# Patient Record
Sex: Female | Born: 1990 | Race: White | Hispanic: No | Marital: Single | Smoking: Never smoker
Health system: Southern US, Community
[De-identification: ages and names within clinical notes are randomized; demographics above are authoritative.]

## PROBLEM LIST (undated history)

## (undated) HISTORY — PX: SHOULDER SURGERY: SHX246

---

## 2016-11-14 ENCOUNTER — Emergency Department (HOSPITAL_COMMUNITY)
Admission: EM | Admit: 2016-11-14 | Discharge: 2016-11-14 | Disposition: A | Discharge status: Home / Self Care | Payer: Self-pay | Attending: Emergency Medicine | Admitting: Emergency Medicine

## 2016-11-14 ENCOUNTER — Encounter (HOSPITAL_COMMUNITY): Payer: Self-pay | Admitting: Emergency Medicine

## 2016-11-14 DIAGNOSIS — Y9389 Activity, other specified: Secondary | ICD-10-CM | POA: Insufficient documentation

## 2016-11-14 DIAGNOSIS — Y999 Unspecified external cause status: Secondary | ICD-10-CM | POA: Insufficient documentation

## 2016-11-14 DIAGNOSIS — H538 Other visual disturbances: Secondary | ICD-10-CM | POA: Insufficient documentation

## 2016-11-14 DIAGNOSIS — Z5321 Procedure and treatment not carried out due to patient leaving prior to being seen by health care provider: Secondary | ICD-10-CM | POA: Insufficient documentation

## 2016-11-14 DIAGNOSIS — S0990XA Unspecified injury of head, initial encounter: Secondary | ICD-10-CM | POA: Insufficient documentation

## 2016-11-14 DIAGNOSIS — W2209XA Striking against other stationary object, initial encounter: Secondary | ICD-10-CM | POA: Insufficient documentation

## 2016-11-14 DIAGNOSIS — R51 Headache: Secondary | ICD-10-CM | POA: Insufficient documentation

## 2016-11-14 DIAGNOSIS — Y929 Unspecified place or not applicable: Secondary | ICD-10-CM | POA: Insufficient documentation

## 2016-11-14 DIAGNOSIS — R42 Dizziness and giddiness: Secondary | ICD-10-CM | POA: Insufficient documentation

## 2016-11-14 DIAGNOSIS — H9319 Tinnitus, unspecified ear: Secondary | ICD-10-CM | POA: Insufficient documentation

## 2016-11-14 NOTE — ED Triage Notes (Addendum)
Patient was helping friend move and hit her head opn brick wall when fell causing her to black out. Patient c/o headache, blurred vision, dizziness and ears ringing.

## 2016-11-16 ENCOUNTER — Encounter (HOSPITAL_COMMUNITY): Payer: Self-pay | Admitting: Emergency Medicine

## 2016-11-16 ENCOUNTER — Emergency Department (HOSPITAL_COMMUNITY): Payer: Managed Care, Other (non HMO)

## 2016-11-16 DIAGNOSIS — Y92009 Unspecified place in unspecified non-institutional (private) residence as the place of occurrence of the external cause: Secondary | ICD-10-CM | POA: Insufficient documentation

## 2016-11-16 DIAGNOSIS — W01198A Fall on same level from slipping, tripping and stumbling with subsequent striking against other object, initial encounter: Secondary | ICD-10-CM | POA: Insufficient documentation

## 2016-11-16 DIAGNOSIS — S060X1A Concussion with loss of consciousness of 30 minutes or less, initial encounter: Secondary | ICD-10-CM | POA: Insufficient documentation

## 2016-11-16 DIAGNOSIS — Y999 Unspecified external cause status: Secondary | ICD-10-CM | POA: Diagnosis not present

## 2016-11-16 DIAGNOSIS — S0990XA Unspecified injury of head, initial encounter: Secondary | ICD-10-CM | POA: Diagnosis present

## 2016-11-16 DIAGNOSIS — Y9389 Activity, other specified: Secondary | ICD-10-CM | POA: Insufficient documentation

## 2016-11-16 NOTE — ED Triage Notes (Signed)
Pt st's she was helping a friend move and hit her head on a brick wall.  Pt st's she blacked out afterwards.  Pt c/o ongoing headache, nausea and vomiting.

## 2016-11-17 ENCOUNTER — Emergency Department (HOSPITAL_COMMUNITY)
Admission: EM | Admit: 2016-11-17 | Discharge: 2016-11-17 | Disposition: A | Discharge status: Home / Self Care | Payer: Managed Care, Other (non HMO) | Attending: Emergency Medicine | Admitting: Emergency Medicine

## 2016-11-17 DIAGNOSIS — S0990XA Unspecified injury of head, initial encounter: Secondary | ICD-10-CM

## 2016-11-17 DIAGNOSIS — S060X1A Concussion with loss of consciousness of 30 minutes or less, initial encounter: Secondary | ICD-10-CM

## 2016-11-17 LAB — CBC WITH DIFFERENTIAL/PLATELET
BASOS ABS: 0 10*3/uL (ref 0.0–0.1)
BASOS PCT: 0 %
EOS ABS: 0.1 10*3/uL (ref 0.0–0.7)
Eosinophils Relative: 2 %
HCT: 41 % (ref 36.0–46.0)
HEMOGLOBIN: 13.5 g/dL (ref 12.0–15.0)
Lymphocytes Relative: 42 %
Lymphs Abs: 3.3 10*3/uL (ref 0.7–4.0)
MCH: 29.5 pg (ref 26.0–34.0)
MCHC: 32.9 g/dL (ref 30.0–36.0)
MCV: 89.7 fL (ref 78.0–100.0)
MONOS PCT: 9 %
Monocytes Absolute: 0.7 10*3/uL (ref 0.1–1.0)
NEUTROS PCT: 47 %
Neutro Abs: 3.6 10*3/uL (ref 1.7–7.7)
Platelets: 256 10*3/uL (ref 150–400)
RBC: 4.57 MIL/uL (ref 3.87–5.11)
RDW: 13.4 % (ref 11.5–15.5)
WBC: 7.8 10*3/uL (ref 4.0–10.5)

## 2016-11-17 LAB — COMPREHENSIVE METABOLIC PANEL
ALK PHOS: 58 U/L (ref 38–126)
ALT: 15 U/L (ref 14–54)
ANION GAP: 9 (ref 5–15)
AST: 20 U/L (ref 15–41)
Albumin: 4.2 g/dL (ref 3.5–5.0)
BILIRUBIN TOTAL: 0.5 mg/dL (ref 0.3–1.2)
BUN: 9 mg/dL (ref 6–20)
CALCIUM: 9.3 mg/dL (ref 8.9–10.3)
CO2: 26 mmol/L (ref 22–32)
CREATININE: 0.91 mg/dL (ref 0.44–1.00)
Chloride: 106 mmol/L (ref 101–111)
Glucose, Bld: 80 mg/dL (ref 65–99)
Potassium: 3.8 mmol/L (ref 3.5–5.1)
SODIUM: 141 mmol/L (ref 135–145)
TOTAL PROTEIN: 6.7 g/dL (ref 6.5–8.1)

## 2016-11-17 LAB — I-STAT BETA HCG BLOOD, ED (MC, WL, AP ONLY)

## 2016-11-17 MED ORDER — NAPROXEN 500 MG PO TABS: 500.0000 mg | ORAL_TABLET | Freq: Two times a day (BID) | ORAL | 0 refills | Status: DC

## 2016-11-17 MED ORDER — OXYCODONE-ACETAMINOPHEN 5-325 MG PO TABS
1.0000 | ORAL_TABLET | Freq: Once | ORAL | Status: AC
  Administered 2016-11-17: 1 via ORAL
  Filled 2016-11-17: qty 1

## 2016-11-17 MED ORDER — HYDROCODONE-ACETAMINOPHEN 5-325 MG PO TABS: 1.0000 | ORAL_TABLET | Freq: Four times a day (QID) | ORAL | 0 refills | Status: DC | PRN

## 2016-11-17 NOTE — Discharge Instructions (Signed)
You were seen today for head injury. You likely have a concussion. Decrease stimulation and rest.Naproxen as needed for pain. Follow-up in concussion clinic for clearance for any physical activity.

## 2016-11-17 NOTE — ED Provider Notes (Signed)
[facility] EMERGENCY DEPARTMENT Provider Note   CSN: [id number] Arrival date & time: 11/16/16  2139     History   Chief Complaint Chief Complaint  Patient presents with  . Head Injury    HPI Kayla Buchanan is a 26 y.o. female.  HPI  This is a 26 year old female who presents with head injury. Patient reports that she was helping her friend move when she hit her head on a wall. She fell forward and struck her forehead. She lost consciousness. This occurred on Saturday evening. Since that time she has had worsening headache, nausea, vomiting, and foggy headedness. Currently her pain is 5 out of 10. She reports left-sided neck pain.  Denies weakness, numbness, tingling of her upper extremities.She has been taking Tylenol with minimal relief. Denies any other injury.    History reviewed. No pertinent past medical history.  There are no active problems to display for this patient.   Past Surgical History:  Procedure Laterality Date  . SHOULDER SURGERY Left     OB History    No data available       Home Medications    Prior to Admission medications   Medication Sig Start Date End Date Taking? Authorizing Provider  HYDROcodone-acetaminophen (NORCO/VICODIN) 5-325 MG tablet Take 1 tablet by mouth every 6 (six) hours as needed. 11/17/16   Khalee Mazo, Mayer Masker, MD  naproxen (NAPROSYN) 500 MG tablet Take 1 tablet (500 mg total) by mouth 2 (two) times daily. 11/17/16   Airianna Kreischer, Mayer Masker, MD    Family History No family history on file.  Social History Social History  Substance Use Topics  . Smoking status: Never Smoker  . Smokeless tobacco: Never Used  . Alcohol use No     Allergies   Patient has no allergy information on record.   Review of Systems Review of Systems  Constitutional: Negative for fever.  Eyes: Positive for photophobia.  Respiratory: Negative for shortness of breath.   Cardiovascular: Negative for chest pain.  Gastrointestinal:  Positive for nausea and vomiting.  Musculoskeletal: Positive for neck pain.  Skin: Negative for wound.  Neurological: Positive for light-headedness and headaches. Negative for weakness.  All other systems reviewed and are negative.    Physical Exam Updated Vital Signs BP (!) 143/76 (BP Location: Right Arm)   Pulse 73   Temp 98.2 F (36.8 C) (Oral)   Resp 14   Ht  (1.676 m)   Wt 64.9 kg (143 lb)   LMP 11/07/2016   SpO2 100%   BMI 23.08 kg/m   Physical Exam  Constitutional: She is oriented to person, place, and time. She appears well-developed and well-nourished. No distress.  HENT:  Head: Normocephalic and atraumatic.  Mouth/Throat: Oropharynx is clear and moist.  No palpable hematoma, tenderness palpation over the left parietal region  Eyes: Pupils are equal, round, and reactive to light. EOM are normal.  Neck: Normal range of motion. Neck supple.  Tenderness to palpation the left paraspinous muscle region of the cervical spine  Cardiovascular: Normal rate, regular rhythm and normal heart sounds.   No murmur heard. Pulmonary/Chest: Effort normal and breath sounds normal. No respiratory distress. She has no wheezes.  Abdominal: Soft. Bowel sounds are normal.  Neurological: She is alert and oriented to person, place, and time.  Cranial nerves II through XII intact, 5 out of 5 strength in all 4 extremity as, no dysmetria to finger-nose-finger  Skin: Skin is warm and dry.  Psychiatric: She has  a normal mood and affect.  Nursing note and vitals reviewed.    ED Treatments / Results  Labs (all labs ordered are listed, but only abnormal results are displayed) Labs Reviewed  CBC WITH DIFFERENTIAL/PLATELET  COMPREHENSIVE METABOLIC PANEL  I-STAT BETA HCG BLOOD, ED ([facility], [facility], [facility] ONLY)    EKG  EKG Interpretation None       Radiology Ct Head Wo Contrast  Result Date: 11/16/2016 CLINICAL DATA:  Headache after hitting head on brick wall. Positive loss of  consciousness and blurred vision. EXAM: CT HEAD WITHOUT CONTRAST TECHNIQUE: Contiguous axial images were obtained from the base of the skull through the vertex without intravenous contrast. COMPARISON:  None. FINDINGS: Brain: No evidence of acute infarction, hemorrhage, hydrocephalus, extra-axial collection or mass lesion/mass effect. Vascular: No hyperdense vessel or unexpected calcification. Skull: Normal. Negative for fracture or focal lesion. Sinuses/Orbits: No acute finding. Other: None IMPRESSION: No acute intracranial abnormality. Electronically Signed   By: Tollie Eth M.D.   On: 11/16/2016 23:14    Procedures Procedures (including critical care time)  Medications Ordered in ED Medications  oxyCODONE-acetaminophen (PERCOCET/ROXICET) 5-325 MG per tablet 1 tablet (not administered)     Initial Impression / Assessment and Plan / ED Course  I have reviewed the triage vital signs and the nursing notes.  Pertinent labs & imaging results that were available during my care of the patient were reviewed by me and considered in my medical decision making (see chart for details).     Patient presents with head injury that occurred on Saturday. She is overall nontoxic-appearing and vital signs are reassuring. Neurologic exam is reassuring as well. CT scan head reviewed from triage and negative. Suspect concussion symptoms. She has no midline neck tenderness. Does not require further imaging at this time. Discussed with patient treatment and symptomatic control. Follow-up in concussion clinic.  After history, exam, and medical workup I feel the patient has been appropriately medically screened and is safe for discharge home. Pertinent diagnoses were discussed with the patient. Patient was given return precautions.   Final Clinical Impressions(s) / ED Diagnoses   Final diagnoses:  Injury of head, initial encounter  Concussion with loss of consciousness of 30 minutes or less, initial encounter     New Prescriptions New Prescriptions   HYDROCODONE-ACETAMINOPHEN (NORCO/VICODIN) 5-325 MG TABLET    Take 1 tablet by mouth every 6 (six) hours as needed.   NAPROXEN (NAPROSYN) 500 MG TABLET    Take 1 tablet (500 mg total) by mouth 2 (two) times daily.     Shon Baton, MD 11/17/16 [phone]

## 2018-04-05 ENCOUNTER — Other Ambulatory Visit: Payer: Self-pay

## 2018-04-05 ENCOUNTER — Inpatient Hospital Stay (HOSPITAL_COMMUNITY)
Admission: EM | Admit: 2018-04-05 | Discharge: 2018-04-07 | DRG: 880 | Disposition: A | Discharge status: Home / Self Care | Payer: 59 | Attending: Internal Medicine | Admitting: Internal Medicine

## 2018-04-05 ENCOUNTER — Encounter (HOSPITAL_COMMUNITY): Payer: Self-pay | Admitting: *Deleted

## 2018-04-05 DIAGNOSIS — D649 Anemia, unspecified: Secondary | ICD-10-CM | POA: Diagnosis not present

## 2018-04-05 DIAGNOSIS — J9690 Respiratory failure, unspecified, unspecified whether with hypoxia or hypercapnia: Secondary | ICD-10-CM

## 2018-04-05 DIAGNOSIS — F445 Conversion disorder with seizures or convulsions: Principal | ICD-10-CM | POA: Diagnosis present

## 2018-04-05 DIAGNOSIS — N179 Acute kidney failure, unspecified: Secondary | ICD-10-CM | POA: Diagnosis present

## 2018-04-05 DIAGNOSIS — R569 Unspecified convulsions: Secondary | ICD-10-CM

## 2018-04-05 DIAGNOSIS — J9600 Acute respiratory failure, unspecified whether with hypoxia or hypercapnia: Secondary | ICD-10-CM | POA: Diagnosis present

## 2018-04-05 DIAGNOSIS — R040 Epistaxis: Secondary | ICD-10-CM | POA: Diagnosis present

## 2018-04-05 DIAGNOSIS — Z8669 Personal history of other diseases of the nervous system and sense organs: Secondary | ICD-10-CM

## 2018-04-05 DIAGNOSIS — Z978 Presence of other specified devices: Secondary | ICD-10-CM

## 2018-04-05 DIAGNOSIS — Z887 Allergy status to serum and vaccine status: Secondary | ICD-10-CM

## 2018-04-05 DIAGNOSIS — Z885 Allergy status to narcotic agent status: Secondary | ICD-10-CM

## 2018-04-05 DIAGNOSIS — E876 Hypokalemia: Secondary | ICD-10-CM | POA: Diagnosis not present

## 2018-04-05 DIAGNOSIS — Z820 Family history of epilepsy and other diseases of the nervous system: Secondary | ICD-10-CM

## 2018-04-05 MED ORDER — OXYMETAZOLINE HCL 0.05 % NA SOLN
1.0000 | Freq: Once | NASAL | Status: AC
  Administered 2018-04-05: 1 via NASAL
  Filled 2018-04-05: qty 30

## 2018-04-05 MED ORDER — LORAZEPAM 2 MG/ML IJ SOLN
1.0000 mg | Freq: Once | INTRAMUSCULAR | Status: AC
  Administered 2018-04-05: 2 mg via INTRAVENOUS
  Filled 2018-04-05: qty 1

## 2018-04-05 MED ORDER — ACETAMINOPHEN 325 MG PO TABS
650.0000 mg | ORAL_TABLET | Freq: Once | ORAL | Status: AC
  Administered 2018-04-06: 650 mg via ORAL
  Filled 2018-04-05: qty 2

## 2018-04-05 MED ORDER — LEVETIRACETAM IN NACL 1000 MG/100ML IV SOLN
INTRAVENOUS | Status: AC
  Administered 2018-04-06
  Filled 2018-04-05: qty 100

## 2018-04-05 MED ORDER — SODIUM CHLORIDE 0.9 % IV BOLUS
500.0000 mL | Freq: Once | INTRAVENOUS | Status: AC
  Administered 2018-04-06: 500 mL via INTRAVENOUS

## 2018-04-05 NOTE — ED Triage Notes (Signed)
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

## 2018-04-05 NOTE — ED Provider Notes (Addendum)
[facility]-EMERGENCY DEPT Provider Note   CSN: [id number] Arrival date & time: 04/05/18  2203    History   Chief Complaint Chief Complaint  Patient presents with  . Epistaxis    HPI Kayla Buchanan is a 28 y.o. female.     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  She reports bleeding, which caused her to cough up blood several times over half hour period of time.  The bleeding stopped spontaneously.  No trauma to the nose.  No recent sinus or nasal symptoms.  She does not take anticoagulant medications.  She notes a headache starting after the bleeding.  No modifying factors.  History reviewed. No pertinent past medical history.  Patient Active Problem List   Diagnosis Date Noted  . Seizure (HCC) 04/06/2018    Past Surgical History:  Procedure Laterality Date  . SHOULDER SURGERY Left      OB History   No obstetric history on file.      Home Medications    Prior to Admission medications   Medication Sig Start Date End Date Taking? Authorizing Provider  HYDROcodone-acetaminophen (NORCO/VICODIN) 5-325 MG tablet Take 1 tablet by mouth every 6 (six) hours as needed. 11/17/16   Horton, Mayer Masker, MD  naproxen (NAPROSYN) 500 MG tablet Take 1 tablet (500 mg total) by mouth 2 (two) times daily. 11/17/16   Horton, Mayer Masker, MD    Family History No family history on file.  Social History Social History   Tobacco Use  . Smoking status: Never Smoker  . Smokeless tobacco: Never Used  Substance Use Topics  . Alcohol use: Yes  . Drug use: No     Allergies   Patient has no allergy information on record.   Review of Systems Review of Systems  All other systems reviewed and are negative.    Physical Exam Updated Vital Signs BP (!) 141/81   Pulse (!) 115   Temp 98.1 F (36.7 C) (Oral)   Resp 16   Ht 5\' 6"  (1.676 m)   Wt 66.7 kg   LMP  (LMP Unknown)   SpO2 100%    BMI 23.73 kg/m   Physical Exam Vitals signs and nursing note reviewed.  Constitutional:      General: She is not in acute distress.    Appearance: She is well-developed. She is not ill-appearing, toxic-appearing or diaphoretic.  HENT:     Head: Normocephalic and atraumatic.     Right Ear: External ear normal.     Left Ear: External ear normal.     Nose:     Comments: Small amount of dried blood in right anterior nares.  No visible site of bleeding in the right nares.  Left nares normal without bleeding, or lesions.    Mouth/Throat:     Mouth: Mucous membranes are moist.     Pharynx: Oropharynx is clear. No oropharyngeal exudate.     Comments: No visible blood in oropharynx. Eyes:     Conjunctiva/sclera: Conjunctivae normal.     Pupils: Pupils are equal, round, and reactive to light.  Neck:     Musculoskeletal: Normal range of motion and neck supple.     Trachea: Phonation normal.  Cardiovascular:     Rate and Rhythm: Normal rate.  Pulmonary:     Effort: Pulmonary effort is normal.  Musculoskeletal: Normal range of motion.  Skin:    General: Skin is warm and  dry.  Neurological:     Mental Status: She is alert and oriented to person, place, and time.     Cranial Nerves: No cranial nerve deficit.     Sensory: No sensory deficit.     Motor: No abnormal muscle tone.     Coordination: Coordination normal.  Psychiatric:        Mood and Affect: Mood normal.        Behavior: Behavior normal.        Thought Content: Thought content normal.        Judgment: Judgment normal.      ED Treatments / Results  Labs (all labs ordered are listed, but only abnormal results are displayed) Labs Reviewed  BASIC METABOLIC PANEL - Abnormal; Notable for the following components:      Result Value   Glucose, Bld 103 (*)    BUN 28 (*)    Creatinine, Ser 1.01 (*)    All other components within normal limits  CBC WITH DIFFERENTIAL/PLATELET - Abnormal; Notable for the following components:    WBC 10.6 (*)    Lymphs Abs 4.6 (*)    Monocytes Absolute 1.4 (*)    All other components within normal limits    EKG None  Radiology No results found.  Procedures Date/Time: 04/06/2018 12:23 AM Performed by: Mancel Bale, MD Pre-anesthesia Checklist: Emergency Drugs available, Patient identified, Patient being monitored and Timeout performed Preoxygenation: Pre-oxygenation with 100% oxygen Induction Type: Rapid sequence Ventilation: Mask ventilation without difficulty Laryngoscope Size: 3 Grade View: Grade III Tube type: Subglottic suction tube Tube size: 7.5 mm Number of attempts: 1 Airway Equipment and Method: Video-laryngoscopy Placement Confirmation: ETT inserted through vocal cords under direct vision,  Breath sounds checked- equal and bilateral and Positive ETCO2 Secured at: 24 cm Tube secured with: ETT holder Dental Injury: Teeth and Oropharynx as per pre-operative assessment     .Critical Care Performed by: Mancel Bale, MD Authorized by: Mancel Bale, MD   Critical care provider statement:    Critical care time (minutes):  45   Critical care start time:  04/05/2018 10:50 PM   Critical care end time:  04/06/2018 12:49 AM   Critical care time was exclusive of:  Separately billable procedures and treating other patients   Critical care was necessary to treat or prevent imminent or life-threatening deterioration of the following conditions:  CNS failure or compromise   Critical care was time spent personally by me on the following activities:  Blood draw for specimens, development of treatment plan with patient or surrogate, discussions with consultants, evaluation of patient's response to treatment, examination of patient, obtaining history from patient or surrogate, ordering and performing treatments and interventions, ordering and review of laboratory studies, pulse oximetry, re-evaluation of patient's condition, review of old charts and ordering and review of  radiographic studies   (including critical care time)  Medications Ordered in ED Medications  acetaminophen (TYLENOL) tablet 650 mg (has no administration in time range)  propofol (DIPRIVAN) 1000 MG/100ML infusion (25 mcg/kg/min  66.7 kg Intravenous Rate/Dose Change 04/06/18 0037)  levETIRAcetam (KEPPRA) IVPB 500 mg/100 mL premix (500 mg Intravenous New Bag/Given 04/06/18 0048)  oxymetazoline (AFRIN) 0.05 % nasal spray 1 spray (1 spray Each Nare Given 04/05/18 2238)  sodium chloride 0.9 % bolus 500 mL (500 mLs Intravenous New Bag/Given 04/06/18 0000)  LORazepam (ATIVAN) injection 1 mg (2 mg Intravenous Given 04/05/18 2354)  levETIRAcetam (KEPPRA) 1000 MG/100ML IVPB (  New Bag/Given 04/06/18 0000)  etomidate (AMIDATE) injection (20  mg Intravenous Given 04/06/18 0004)  succinylcholine (ANECTINE) injection (125 mg Intravenous Given 04/06/18 0004)  fentaNYL (SUBLIMAZE) injection 100 mcg (100 mcg Intravenous Given 04/06/18 0022)  LORazepam (ATIVAN) injection 2 mg (2 mg Intravenous Given 04/06/18 0017)     Initial Impression / Assessment and Plan / ED Course  I have reviewed the triage vital signs and the nursing notes.  Pertinent labs & imaging results that were available during my care of the patient were reviewed by me and considered in my medical decision making (see chart for details).  Clinical Course as of Apr 06 47  Wed Apr 06, 2018  0030 No bleeding or evident tumor, images reviewed by me  CT Head Wo Contrast [EW]  0030 Normal except mild elevation glucose, BUN and creatinine.  Basic metabolic panel(!) [EW]  0031 Normal except mild white cell elevation  CBC with Differential(!) [EW]    Clinical Course User Index [EW] Mancel Bale, MD        Patient Vitals for the past 24 hrs:  BP Temp Temp src Pulse Resp SpO2 Height Weight  04/06/18 0039 - - - - - 100 % - -  04/06/18 0015 (!) 141/81 - - (!) 115 16 99 % - -  04/06/18 0012 - - - 98 (!) 21 100 % - -  04/06/18 0007 139/88 - - (!) 122 19  100 % - -  04/05/18 2350 (!) 140/92 - - 73 (!) 41 98 % - -  04/05/18 2224 130/78 98.1 F (36.7 C) Oral 66 12 99 %  (1.676 m) 66.7 kg    11:33 PM Reevaluation with update and discussion. After initial assessment and treatment, an updated evaluation reveals no change in clinical status.  Findings discussed with patient and a friend with her, questions answered. Mancel Bale   Approximately 11:45 PM-patient was with staff, when she collapsed while sitting in the wheelchair.  She became unresponsive.  She was taken immediately to the high-level, treatment area.  She was put on the stretcher, I was in the room.  The patient was having tonic clonic activity of both arms and legs.  Left pupil 8 mm and reactive, right pupil 4 mm, reactive.  Patient's jaw was clenched tightly.  Treatment initiated with placement of IV and Ativan ordered.  After 2 mg of Ativan, no significant improvement in seizing.  IV Keppra ordered and preparations made for intubation.  Patient intubated to protect airway, and decrease muscular seizing.  Patient's friends were in the hallway near her.  I was able to contact the patient's mother and discussed the situation with her.  Mother states that the patient has history of seizure disorder as a child, was previously treated with Depakote.  She also had a seizure about 3 years ago, which was a single episode and did not require treatment at that time.  Medical Decision Making: Nonspecific nasal bleeding right side.  Bleeding stopped spontaneously.  This was likely posterior bleed, there is no evidence for anterior bleeding at this time.  Patient decompensated in the emergency department, became unresponsive then was noted to have seizure.  Seizure did not stop with benzodiazepine, so she was paralyzed and intubated.  Placed on propofol drip to control patient for CT imaging.   CRITICAL CARE-yes Performed by: Mancel Bale  Nursing Notes Reviewed/ Care Coordinated Applicable  Imaging Reviewed Interpretation of Laboratory Data incorporated into ED treatment  12:35 AM-discussed with neuro hospitalist, Dr. Amada Jupiter.  Advises give full 1500 mg of Keppra,  then lighten sedation, to see if she still seizing.  He requests that she be transferred to the ICU at [facility], for admission.  12:40 AM-case discussed with on-call intensivist, who agrees to admission.  Patient will be transferred for further treatment.  She may require an EEG this evening.   Final Clinical Impressions(s) / ED Diagnoses   Final diagnoses:  Epistaxis  Seizure [facility])    ED Discharge Orders    None           Mancel Bale, MD 04/06/18 [phone]

## 2018-04-05 NOTE — ED Notes (Signed)
Combat gauze was placed by EMT first on pt arrival to ED. Combat gauze coming out, no additional bleeding noted in the nare. Blood clot noted in right nare, afrin administered. Pt given gauze at this time, will continue to monitor.

## 2018-04-06 ENCOUNTER — Inpatient Hospital Stay (HOSPITAL_COMMUNITY): Payer: 59

## 2018-04-06 ENCOUNTER — Emergency Department (HOSPITAL_COMMUNITY): Payer: 59

## 2018-04-06 DIAGNOSIS — F445 Conversion disorder with seizures or convulsions: Secondary | ICD-10-CM | POA: Diagnosis present

## 2018-04-06 DIAGNOSIS — R569 Unspecified convulsions: Secondary | ICD-10-CM | POA: Diagnosis not present

## 2018-04-06 DIAGNOSIS — N179 Acute kidney failure, unspecified: Secondary | ICD-10-CM | POA: Diagnosis present

## 2018-04-06 DIAGNOSIS — Z978 Presence of other specified devices: Secondary | ICD-10-CM

## 2018-04-06 DIAGNOSIS — R040 Epistaxis: Secondary | ICD-10-CM | POA: Diagnosis present

## 2018-04-06 DIAGNOSIS — Z8669 Personal history of other diseases of the nervous system and sense organs: Secondary | ICD-10-CM | POA: Diagnosis not present

## 2018-04-06 DIAGNOSIS — J9600 Acute respiratory failure, unspecified whether with hypoxia or hypercapnia: Secondary | ICD-10-CM | POA: Diagnosis present

## 2018-04-06 DIAGNOSIS — Z820 Family history of epilepsy and other diseases of the nervous system: Secondary | ICD-10-CM | POA: Diagnosis not present

## 2018-04-06 DIAGNOSIS — Z885 Allergy status to narcotic agent status: Secondary | ICD-10-CM | POA: Diagnosis not present

## 2018-04-06 DIAGNOSIS — E876 Hypokalemia: Secondary | ICD-10-CM | POA: Diagnosis not present

## 2018-04-06 DIAGNOSIS — D649 Anemia, unspecified: Secondary | ICD-10-CM | POA: Diagnosis not present

## 2018-04-06 DIAGNOSIS — Z887 Allergy status to serum and vaccine status: Secondary | ICD-10-CM | POA: Diagnosis not present

## 2018-04-06 LAB — CBC WITH DIFFERENTIAL/PLATELET
ABS IMMATURE GRANULOCYTES: 0.02 10*3/uL (ref 0.00–0.07)
BASOS ABS: 0 10*3/uL (ref 0.0–0.1)
Basophils Relative: 0 %
EOS ABS: 0.2 10*3/uL (ref 0.0–0.5)
Eosinophils Relative: 2 %
HEMATOCRIT: 39 % (ref 36.0–46.0)
HEMOGLOBIN: 12.7 g/dL (ref 12.0–15.0)
Immature Granulocytes: 0 %
LYMPHS ABS: 4.6 10*3/uL — AB (ref 0.7–4.0)
LYMPHS PCT: 44 %
MCH: 30.2 pg (ref 26.0–34.0)
MCHC: 32.6 g/dL (ref 30.0–36.0)
MCV: 92.9 fL (ref 80.0–100.0)
MONOS PCT: 13 %
Monocytes Absolute: 1.4 10*3/uL — ABNORMAL HIGH (ref 0.1–1.0)
NEUTROS PCT: 41 %
Neutro Abs: 4.4 10*3/uL (ref 1.7–7.7)
Platelets: 287 10*3/uL (ref 150–400)
RBC: 4.2 MIL/uL (ref 3.87–5.11)
RDW: 13.2 % (ref 11.5–15.5)
WBC: 10.6 10*3/uL — ABNORMAL HIGH (ref 4.0–10.5)
nRBC: 0 % (ref 0.0–0.2)

## 2018-04-06 LAB — POCT I-STAT 7, (LYTES, BLD GAS, ICA,H+H)
Acid-base deficit: 1 mmol/L (ref 0.0–2.0)
Bicarbonate: 22.3 mmol/L (ref 20.0–28.0)
Calcium, Ion: 1.23 mmol/L (ref 1.15–1.40)
HCT: 29 % — ABNORMAL LOW (ref 36.0–46.0)
Hemoglobin: 9.9 g/dL — ABNORMAL LOW (ref 12.0–15.0)
O2 Saturation: 100 %
POTASSIUM: 3.5 mmol/L (ref 3.5–5.1)
Patient temperature: 98.6
Sodium: 139 mmol/L (ref 135–145)
TCO2: 23 mmol/L (ref 22–32)
pCO2 arterial: 31.3 mmHg — ABNORMAL LOW (ref 32.0–48.0)
pH, Arterial: 7.462 — ABNORMAL HIGH (ref 7.350–7.450)
pO2, Arterial: 178 mmHg — ABNORMAL HIGH (ref 83.0–108.0)

## 2018-04-06 LAB — MRSA PCR SCREENING: MRSA by PCR: NEGATIVE

## 2018-04-06 LAB — CBC
HCT: 33 % — ABNORMAL LOW (ref 36.0–46.0)
Hemoglobin: 10.7 g/dL — ABNORMAL LOW (ref 12.0–15.0)
MCH: 28.9 pg (ref 26.0–34.0)
MCHC: 32.4 g/dL (ref 30.0–36.0)
MCV: 89.2 fL (ref 80.0–100.0)
NRBC: 0 % (ref 0.0–0.2)
Platelets: 245 10*3/uL (ref 150–400)
RBC: 3.7 MIL/uL — ABNORMAL LOW (ref 3.87–5.11)
RDW: 13.1 % (ref 11.5–15.5)
WBC: 6.9 10*3/uL (ref 4.0–10.5)

## 2018-04-06 LAB — BASIC METABOLIC PANEL
ANION GAP: 5 (ref 5–15)
ANION GAP: 7 (ref 5–15)
BUN: 22 mg/dL — ABNORMAL HIGH (ref 6–20)
BUN: 28 mg/dL — ABNORMAL HIGH (ref 6–20)
CALCIUM: 9.4 mg/dL (ref 8.9–10.3)
CO2: 23 mmol/L (ref 22–32)
CO2: 24 mmol/L (ref 22–32)
Calcium: 8.6 mg/dL — ABNORMAL LOW (ref 8.9–10.3)
Chloride: 107 mmol/L (ref 98–111)
Chloride: 111 mmol/L (ref 98–111)
Creatinine, Ser: 0.88 mg/dL (ref 0.44–1.00)
Creatinine, Ser: 1.01 mg/dL — ABNORMAL HIGH (ref 0.44–1.00)
GFR calc Af Amer: >60 mL/min (ref >60)
GFR calc Af Amer: >60 mL/min (ref >60)
GFR calc non Af Amer: >60 mL/min (ref >60)
GFR calc non Af Amer: >60 mL/min (ref >60)
GLUCOSE: 103 mg/dL — AB (ref 70–99)
GLUCOSE: 104 mg/dL — AB (ref 70–99)
POTASSIUM: 4.2 mmol/L (ref 3.5–5.1)
Potassium: 3.6 mmol/L (ref 3.5–5.1)
Sodium: 138 mmol/L (ref 135–145)
Sodium: 139 mmol/L (ref 135–145)

## 2018-04-06 LAB — MAGNESIUM: Magnesium: 2 mg/dL (ref 1.7–2.4)

## 2018-04-06 LAB — PHOSPHORUS: Phosphorus: 3.4 mg/dL (ref 2.5–4.6)

## 2018-04-06 LAB — HIV ANTIBODY (ROUTINE TESTING W REFLEX): HIV Screen 4th Generation wRfx: NONREACTIVE

## 2018-04-06 MED ORDER — ONDANSETRON HCL 4 MG/2ML IJ SOLN
4.0000 mg | Freq: Four times a day (QID) | INTRAMUSCULAR | Status: DC | PRN
  Administered 2018-04-06 (×2): 4 mg via INTRAVENOUS
  Filled 2018-04-06: qty 2

## 2018-04-06 MED ORDER — SUCCINYLCHOLINE CHLORIDE 20 MG/ML IJ SOLN
INTRAMUSCULAR | Status: AC | PRN
  Administered 2018-04-06: 125 mg via INTRAVENOUS

## 2018-04-06 MED ORDER — CHLORHEXIDINE GLUCONATE 0.12% ORAL RINSE (MEDLINE KIT)
15.0000 mL | Freq: Two times a day (BID) | OROMUCOSAL | Status: DC
  Administered 2018-04-06: 15 mL via OROMUCOSAL

## 2018-04-06 MED ORDER — FENTANYL CITRATE (PF) 100 MCG/2ML IJ SOLN
INTRAMUSCULAR | Status: AC
  Administered 2018-04-06: 100 ug via INTRAVENOUS
  Filled 2018-04-06: qty 2

## 2018-04-06 MED ORDER — PANTOPRAZOLE SODIUM 40 MG IV SOLR: 40.0000 mg | Freq: Every day | INTRAVENOUS | Status: DC

## 2018-04-06 MED ORDER — ETOMIDATE 2 MG/ML IV SOLN
INTRAVENOUS | Status: AC | PRN
  Administered 2018-04-06: 20 mg via INTRAVENOUS

## 2018-04-06 MED ORDER — HEPARIN SODIUM (PORCINE) 5000 UNIT/ML IJ SOLN
5000.0000 [IU] | Freq: Three times a day (TID) | INTRAMUSCULAR | Status: DC
  Administered 2018-04-06 – 2018-04-07 (×4): 5000 [IU] via SUBCUTANEOUS
  Filled 2018-04-06 (×4): qty 1

## 2018-04-06 MED ORDER — FENTANYL CITRATE (PF) 100 MCG/2ML IJ SOLN
INTRAMUSCULAR | Status: AC
  Filled 2018-04-06: qty 2

## 2018-04-06 MED ORDER — FENTANYL CITRATE (PF) 100 MCG/2ML IJ SOLN
100.0000 ug | INTRAMUSCULAR | Status: DC | PRN
  Filled 2018-04-06 (×2): qty 2

## 2018-04-06 MED ORDER — IBUPROFEN 200 MG PO TABS
400.0000 mg | ORAL_TABLET | Freq: Two times a day (BID) | ORAL | Status: DC | PRN
  Administered 2018-04-06 – 2018-04-07 (×2): 400 mg via ORAL
  Filled 2018-04-06 (×2): qty 2

## 2018-04-06 MED ORDER — LORAZEPAM 2 MG/ML IJ SOLN
INTRAMUSCULAR | Status: AC
  Administered 2018-04-06: 2 mg
  Filled 2018-04-06: qty 1

## 2018-04-06 MED ORDER — SODIUM CHLORIDE 0.9 % IV SOLN
INTRAVENOUS | Status: DC
  Administered 2018-04-06 (×2): via INTRAVENOUS

## 2018-04-06 MED ORDER — LEVETIRACETAM IN NACL 500 MG/100ML IV SOLN
500.0000 mg | Freq: Once | INTRAVENOUS | Status: AC
  Administered 2018-04-06: 500 mg via INTRAVENOUS
  Filled 2018-04-06: qty 100

## 2018-04-06 MED ORDER — ONDANSETRON HCL 4 MG/2ML IJ SOLN
INTRAMUSCULAR | Status: AC
  Filled 2018-04-06: qty 2

## 2018-04-06 MED ORDER — LORAZEPAM 2 MG/ML IJ SOLN: 2.0000 mg | Freq: Once | INTRAMUSCULAR | Status: AC

## 2018-04-06 MED ORDER — LEVETIRACETAM IN NACL 500 MG/100ML IV SOLN
500.0000 mg | Freq: Two times a day (BID) | INTRAVENOUS | Status: DC
  Administered 2018-04-06 – 2018-04-07 (×3): 500 mg via INTRAVENOUS
  Filled 2018-04-06 (×3): qty 100

## 2018-04-06 MED ORDER — LORAZEPAM 2 MG/ML IJ SOLN
2.0000 mg | Freq: Once | INTRAMUSCULAR | Status: AC
  Administered 2018-04-06: 2 mg via INTRAVENOUS

## 2018-04-06 MED ORDER — ORAL CARE MOUTH RINSE
15.0000 mL | OROMUCOSAL | Status: DC
  Administered 2018-04-06 (×4): 15 mL via OROMUCOSAL

## 2018-04-06 MED ORDER — LORAZEPAM 2 MG/ML IJ SOLN
INTRAMUSCULAR | Status: AC
  Filled 2018-04-06: qty 1

## 2018-04-06 MED ORDER — PROPOFOL 1000 MG/100ML IV EMUL
5.0000 ug/kg/min | INTRAVENOUS | Status: DC
  Administered 2018-04-06: 5 ug/kg/min via INTRAVENOUS
  Administered 2018-04-06: 30 ug/kg/min via INTRAVENOUS
  Filled 2018-04-06 (×2): qty 100

## 2018-04-06 MED ORDER — FENTANYL CITRATE (PF) 100 MCG/2ML IJ SOLN
100.0000 ug | Freq: Once | INTRAMUSCULAR | Status: AC
  Administered 2018-04-06: 100 ug via INTRAVENOUS

## 2018-04-06 MED ORDER — FENTANYL CITRATE (PF) 100 MCG/2ML IJ SOLN
100.0000 ug | INTRAMUSCULAR | Status: DC | PRN
  Administered 2018-04-06 (×2): 100 ug via INTRAVENOUS

## 2018-04-06 NOTE — Progress Notes (Signed)
NAME:  Kayla Buchanan, MRN:  [id number], DOB:  27-Dec-1990, LOS: 0 ADMISSION DATE:  04/05/2018, CONSULTATION DATE:  04/06/18 REFERRING MD:  Effie Shy  CHIEF COMPLAINT:  Seizures   Brief History   Kayla Buchanan is a 28 y.o. female who was admitted 3/4 with status epilepticus.  History of present illness   Pt is encephelopathic; therefore, this HPI is obtained from chart review. Kayla Buchanan is a 28 y.o. female who has a PMH as outlined below (see "past medical history").  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  Afrin was administered and the nares were packed with combat gauze.  Bleeding controlled with these measures.  Around 11:45 PM, pt collapsed and became unresponsive.  She had tonic clonic activity of both arms and legs.  She did not respond to ativan or keppra; therefore was intubated.  She was then loaded with 1500mg  of Keppra after discussions with neurology.  Per mother, pt has hx of seizures as a child and was previously on Depakote.  She last had a seizure roughly 3 years prior which resolved spontaneously.  She was transferred to [facility] for further evaluation and monitoring.  Past Medical History  Seizures.  Significant Hospital Events   3/4 > admit.  Consults:  Neuro.  Procedures:  ETT 3/4 >   Significant Diagnostic Tests:  CT head 3/4 > negative. 04/06/2018 EEG>>  Micro Data:  None.  Antimicrobials:  None.   Interim history/subjective:  Currently she is sedated poorly responsive  Objective:  Blood pressure 102/61, pulse 84, temperature 97.9 F (36.6 C), temperature source Axillary, resp. rate (!) 21, height 5\' 6"  (1.676 m), weight 64.8 kg, SpO2 100 %.    Vent Mode: PSV;CPAP FiO2 (%):  [40 %-100 %] 40 % Set Rate:  [16 bmp] 16 bmp Vt Set:  [430 mL-470 mL] 470 mL PEEP:  [5 cmH20] 5 cmH20 Pressure Support:  [5 cmH20] 5 cmH20 Plateau Pressure:  [14 cmH20-15 cmH20] 15 cmH20   Intake/Output  Summary (Last 24 hours) at 04/06/2018 7048 Last data filed at 04/06/2018 0800 Gross per 24 hour  Intake 1523.24 ml  Output 515 ml  Net 1008.24 ml   Filed Weights   04/05/18 2224 04/06/18 0500  Weight: 66.7 kg 64.8 kg    Examination: General: Nourished well-developed female who is currently sedated on full mechanical auditory support HEENT: Tracheal tube in place, pupils equal reactive. Neuro: Currently sedated but withdraws to noxious stimuli CV: s1s2 rrr, no m/r/g PULM: even/non-labored, lungs bilaterally diminished GQ:BVQX, non-tender, bsx4 active  Extremities: warm/dry, negative edema  Skin: no rashes or lesions   Assessment & Plan:   Status epilepticus. -Appreciate neurology input -Continue Keppra dosing per neurology -EEG in progress -Check drug screen 04/06/2018  Respiratory insufficiency - due to inability to protect the airway in the setting of above. -Currently on full ventilatory support -Evaluate for spontaneous breathing trial -Extubated stable -Serial chest x-rays  AKI. Lab Results  Component Value Date   CREATININE 0.88 04/06/2018   CREATININE 1.01 (H) 04/05/2018   CREATININE 0.91 11/16/2016   Recent Labs  Lab 04/05/18 2358 04/06/18 0342 04/06/18 0405  K 4.2 3.6 3.5     -Continue lactated Ringer's -Follow-up chemistry  Epistaxis - resolved. -Monitor for further bleeding -Monitor hemoglobin hematocrit   Best Practice:  Diet: NPO. Pain/Anxiety/Delirium protocol (if indicated): Propofol gtt / fentanyl PRN.  RASS goal 0 to -1. VAP protocol (if  indicated): In place. DVT prophylaxis: SCD's / Heparin. GI prophylaxis: PPI. Glucose control: N/A. Mobility: Bedrest. Code Status: Full. Family Communication: Boyfriend was asleep at time of examination Disposition: ICU.  Labs   CBC: Recent Labs  Lab 04/05/18 2358 04/06/18 0342 04/06/18 0405  WBC 10.6* 6.9  --   NEUTROABS 4.4  --   --   HGB 12.7 10.7* 9.9*  HCT 39.0 33.0* 29.0*  MCV 92.9  89.2  --   PLT 287 245  --    Basic Metabolic Panel: Recent Labs  Lab 04/05/18 2358 04/06/18 0342 04/06/18 0405  NA 138 139 139  K 4.2 3.6 3.5  CL 107 111  --   CO2 24 23  --   GLUCOSE 103* 104*  --   BUN 28* 22*  --   CREATININE 1.01* 0.88  --   CALCIUM 9.4 8.6*  --   MG  --  2.0  --   PHOS  --  3.4  --    GFR: Estimated Creatinine Clearance: 90.7 mL/min (by C-G formula based on SCr of 0.88 mg/dL). Recent Labs  Lab 04/05/18 2358 04/06/18 0342  WBC 10.6* 6.9   Liver Function Tests: No results for input(s): AST, ALT, ALKPHOS, BILITOT, PROT, ALBUMIN in the last 168 hours. No results for input(s): LIPASE, AMYLASE in the last 168 hours. No results for input(s): AMMONIA in the last 168 hours. ABG    Component Value Date/Time   PHART 7.462 (H) 04/06/2018 0405   PCO2ART 31.3 (L) 04/06/2018 0405   PO2ART 178.0 (H) 04/06/2018 0405   HCO3 22.3 04/06/2018 0405   TCO2 23 04/06/2018 0405   ACIDBASEDEF 1.0 04/06/2018 0405   O2SAT 100.0 04/06/2018 0405    Coagulation Profile: No results for input(s): INR, PROTIME in the last 168 hours. Cardiac Enzymes: No results for input(s): CKTOTAL, CKMB, CKMBINDEX, TROPONINI in the last 168 hours. HbA1C: No results found for: HGBA1C CBG: No results for input(s): GLUCAP in the last 168 hours.    Critical care time: 30 min.    Brett Canales Aleatha Taite ACNP [facility] PCCM Pager [phone] till 1 pm If no answer page [phone] 04/06/2018, 9:09 AM\

## 2018-04-06 NOTE — Progress Notes (Signed)
28 year old woman who was in the ED for epistaxis and then developed a seizure requiring intubation.  She does have a history of seizures, last one was 3 years ago, was previously on Depakote, this time loaded with Keppra.  She was sedated with propofol, no events overnight. This morning on exam-on lower dose of propofol, alert and interactive gagging and coughing on the tube, clear breath sounds bilateral, nonfocal, S1-S2 tacky, soft and nontender abdomen.  Head CT was negative chest x-ray personally reviewed which did not show any infiltrates. Labs showed mild hypokalemia and mild anemia, no leukocytosis.  Impression/plan Acute respiratory failure-she was intubated for airway protection. We did a rapid wean and she was tolerating pressure support 5/5 and we extubated her to nasal cannula and she seems to be tolerating well.  She was given Zofran for nausea and vomiting  Seizure-await EEG and further neurology recommendations. Epistaxis appears to be resolved. If she remained stable for 12 hours, she can transfer to the floor  The patient is critically ill with multiple organ systems failure and requires high complexity decision making for assessment and support, frequent evaluation and titration of therapies, application of advanced monitoring technologies and extensive interpretation of multiple databases. Critical Care Time devoted to patient care services described in this note independent of APP/resident  time is 31 minutes.   Kayla Locket Vassie Loll MD

## 2018-04-06 NOTE — Procedures (Signed)
  [place] A. Merlene Laughter, MD     [email or web address]           HISTORY: This a 28 year old female who presents with seizures.  MEDICATIONS:  Current Facility-Administered Medications:  .  0.9 %  sodium chloride infusion, , Intravenous, Continuous, Desai, Rahul P, PA-C, Last Rate: 75 mL/hr at 04/06/18 1549 .  chlorhexidine gluconate (MEDLINE KIT) (PERIDEX) 0.12 % solution 15 mL, 15 mL, Mouth Rinse, BID, Greta Doom, MD, 15 mL at 04/06/18 0156 .  heparin injection 5,000 Units, 5,000 Units, Subcutaneous, Q8H, Desai, Rahul P, PA-C, 5,000 Units at 04/06/18 1544 .  levETIRAcetam (KEPPRA) IVPB 500 mg/100 mL premix, 500 mg, Intravenous, Q12H, Greta Doom, MD, Last Rate: 400 mL/hr at 04/06/18 1549, 500 mg at 04/06/18 1549 .  MEDLINE mouth rinse, 15 mL, Mouth Rinse, 10 times per day, Greta Doom, MD, 15 mL at 04/06/18 1234 .  ondansetron (ZOFRAN) injection 4 mg, 4 mg, Intravenous, Q6H PRN, Rigoberto Noel, MD, 4 mg at 04/06/18 1803     ANALYSIS: A 16 channel recording using standard 10 20 measurements is conducted for 23 minutes.  There is a well-formed posterior dominant rhythm of 11 Hz which attenuates with eye opening.  Awake and drowsy activities are observed although this changes to stage II non-REM sleep with spindles and K complexes documented.  Photic stimulation and hyperventilation are not conducted.  There is no focal or lateralized slowing.  There is no epileptiform activity observed.   IMPRESSION: This is a normal recording of the awake and sleep states.      Grazia Taffe A. Merlene Laughter, M.D.  Diplomate, Tax adviser of Psychiatry and Neurology ( Neurology).

## 2018-04-06 NOTE — ED Notes (Addendum)
[facility] at bedside 

## 2018-04-06 NOTE — Progress Notes (Signed)
EEG completed, results pending. 

## 2018-04-06 NOTE — H&P (Signed)
NAME:  Kayla Buchanan, MRN:  [id number], DOB:  05-28-1990, LOS: 0 ADMISSION DATE:  04/05/2018, CONSULTATION DATE:  04/06/18 REFERRING MD:  Effie Shy  CHIEF COMPLAINT:  Seizures   Brief History   Kayla Buchanan is a 28 y.o. female who was admitted 3/4 with status epilepticus.  History of present illness   Pt is encephelopathic; therefore, this HPI is obtained from chart review. Kayla Buchanan is a 27 y.o. female who has a PMH as outlined below (see "past medical history").  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  Afrin was administered and the nares were packed with combat gauze.  Bleeding controlled with these measures.  Around 11:45 PM, pt collapsed and became unresponsive.  She had tonic clonic activity of both arms and legs.  She did not respond to ativan or keppra; therefore was intubated.  She was then loaded with 1500mg  of Keppra after discussions with neurology.  Per mother, pt has hx of seizures as a child and was previously on Depakote.  She last had a seizure roughly 3 years prior which resolved spontaneously.  She was transferred to [facility] for further evaluation and monitoring.  Past Medical History  Seizures.  Significant Hospital Events   3/4 > admit.  Consults:  Neuro.  Procedures:  ETT 3/4 >   Significant Diagnostic Tests:  CT head 3/4 > negative.  Micro Data:  None.  Antimicrobials:  None.   Interim history/subjective:  Sedated, unresponsive.  Comfortable.  Objective:  Blood pressure (!) 98/53, pulse 66, temperature 98.1 F (36.7 C), temperature source Oral, resp. rate 18, height 5\' 6"  (1.676 m), weight 66.7 kg, SpO2 100 %.    Vent Mode: PRVC FiO2 (%):  [100 %] 100 % Set Rate:  [16 bmp] 16 bmp Vt Set:  [430 mL-470 mL] 470 mL PEEP:  [5 cmH20] 5 cmH20   Intake/Output Summary (Last 24 hours) at 04/06/2018 0210 Last data filed at 04/06/2018 0121 Gross per 24 hour  Intake 1200 ml  Output -    Net 1200 ml   Filed Weights   04/05/18 2224  Weight: 66.7 kg    Examination: General: Young female, in NAD. Neuro: Sedated, does not follow commands. HEENT: [place]/AT. Sclerae anicteric.  ETT in place. Cardiovascular: RRR, no M/R/G.  Lungs: Respirations even and unlabored.  CTA bilaterally, No W/R/R.  Abdomen: BS x 4, soft, NT/ND.  Musculoskeletal: No gross deformities, no edema.  Skin: Intact, warm, no rashes.  Assessment & Plan:   Status epilepticus. - Neuro following, appreciate the assistance. - Continue keppra per neuro. - Possible cEEG, defer to neuro.  Respiratory insufficiency - due to inability to protect the airway in the setting of above. - Full vent support. - Assess ABG. - Wean as able. - Daily SBT. - Bronchial hygiene. - Follow CXR.  AKI. - LR @ 75. - Follow BMP.  Epistaxis - resolved. - Monitor for further bleeding.   Best Practice:  Diet: NPO. Pain/Anxiety/Delirium protocol (if indicated): Propofol gtt / fentanyl PRN.  RASS goal 0 to -1. VAP protocol (if indicated): In place. DVT prophylaxis: SCD's / Heparin. GI prophylaxis: PPI. Glucose control: N/A. Mobility: Bedrest. Code Status: Full. Family Communication: Boyfriend and friend updated. Disposition: ICU.  Labs   CBC: Recent Labs  Lab 04/05/18 2358  WBC 10.6*  NEUTROABS 4.4  HGB 12.7  HCT 39.0  MCV 92.9  PLT 287   Basic Metabolic Panel:  Recent Labs  Lab 04/05/18 2358  NA 138  K 4.2  CL 107  CO2 24  GLUCOSE 103*  BUN 28*  CREATININE 1.01*  CALCIUM 9.4   GFR: Estimated Creatinine Clearance: 79 mL/min (A) (by C-G formula based on SCr of 1.01 mg/dL (H)). Recent Labs  Lab 04/05/18 2358  WBC 10.6*   Liver Function Tests: No results for input(s): AST, ALT, ALKPHOS, BILITOT, PROT, ALBUMIN in the last 168 hours. No results for input(s): LIPASE, AMYLASE in the last 168 hours. No results for input(s): AMMONIA in the last 168 hours. ABG No results found for: PHART, PCO2ART,  PO2ART, HCO3, TCO2, ACIDBASEDEF, O2SAT  Coagulation Profile: No results for input(s): INR, PROTIME in the last 168 hours. Cardiac Enzymes: No results for input(s): CKTOTAL, CKMB, CKMBINDEX, TROPONINI in the last 168 hours. HbA1C: No results found for: HGBA1C CBG: No results for input(s): GLUCAP in the last 168 hours.  Review of Systems:   Unable to obtain as pt is encephalopathic.  Past medical history  She,  has no past medical history on file.   Surgical History    Past Surgical History:  Procedure Laterality Date  . SHOULDER SURGERY Left      Social History   reports that she has never smoked. She has never used smokeless tobacco. She reports current alcohol use. She reports that she does not use drugs.   Family history   Her family history is not on file.   Allergies Allergies  Allergen Reactions  . Morphine Anaphylaxis  . Pertussis Vaccines Anaphylaxis     Home meds  Prior to Admission medications   Medication Sig Start Date End Date Taking? Authorizing Provider  HYDROcodone-acetaminophen (NORCO/VICODIN) 5-325 MG tablet Take 1 tablet by mouth every 6 (six) hours as needed. 11/17/16   Horton, Mayer Masker, MD  naproxen (NAPROSYN) 500 MG tablet Take 1 tablet (500 mg total) by mouth 2 (two) times daily. 11/17/16   Shon Baton, MD    Critical care time: 30 min.    Rutherford Guys, PA [facility] Pager: [phone].  If no answer, [phone] 04/06/2018, 3:30 AM

## 2018-04-06 NOTE — ED Notes (Signed)
Pt's belongings given to her friends, clothes, shoes, earrings, landyard with keys and cell phone

## 2018-04-06 NOTE — Procedures (Signed)
Extubation Procedure Note  Patient Details:   Name: Kayla Buchanan DOB: Dec 07, 1990 MRN: [id number]   Airway Documentation:  Airway 7.5 mm (Active)  Secured at (cm) 24 cm 04/05/2018 12:00 AM   Vent end date: 04/06/18 Vent end time: 0830   Evaluation  O2 sats: stable throughout Complications: No apparent complications Patient did tolerate procedure well. Bilateral Breath Sounds: Clear   Yes   Pt was extubated and placed on 2 L [place] per order of MD. Pt's vitals are stable. RT will continue to monitor.     Merlene Laughter 04/06/2018, 8:51 AM

## 2018-04-06 NOTE — Progress Notes (Signed)
vLTM EEG running. Educated friend in room how to use the event button when Pt has a spell. No skin breakdown. Notified neuro

## 2018-04-06 NOTE — Progress Notes (Signed)
Interim Note  She was extubated this morning.  Patient had 2 episodes of seizure-like activity since extubation.  The episodes were described as jerking movements all over her body without jerking of her head.  Critical care team witnessed the first visit and stated that the jerking was distractible. She received 2 mg of Ativan which resolved seizure-like activity.  I was called again by the nurse around 2:30 PM of second episode which was similar.  It had resolved spontaneously by the time he went to see the patient and she was asleep.  She did receive a routine EEG this morning however this did not capture the episode.  Since patient is having multiple spells while in the hospital I think it have a high chance of capturing another spell on long-term EEG.  I have a strong suspicion of nonepileptic spells the long-term EEG with help guide treatment decision.

## 2018-04-06 NOTE — ED Notes (Signed)
ED TO INPATIENT HANDOFF REPORT  ED Nurse Name and Phone #: Jeanice Lim [phone]  S Name/Age/Gender Kayla Buchanan 28 y.o. female Room/Bed: WA16/WA16  Code Status   Code Status: Not on file  Home/SNF/Other Home Patient oriented to: Sedated Is this baseline? 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    Allergies Allergies  Allergen Reactions  . Morphine Anaphylaxis  . Pertussis Vaccines Anaphylaxis    Level of Care/Admitting Diagnosis ED Disposition    ED Disposition Condition Comment   Admit  Hospital Area: [facility] [[id number]]  Level of Care: ICU [6]  Diagnosis: Seizure [facility]) [[id number]]  Admitting Physician: Carin Hock [[id number]]  Attending Physician: Carin Hock [[id number]]  Estimated length of stay: past midnight tomorrow  Certification:: I certify this patient will need inpatient services for at least 2 midnights  PT Class (Do Not Modify): Inpatient [101]  PT Acc Code (Do Not Modify): Private [1]       B Medical/Surgery History History reviewed. No pertinent past medical history. Past Surgical History:  Procedure Laterality Date  . SHOULDER SURGERY Left      A IV Location/Drains/Wounds Patient Lines/Drains/Airways Status   Active Line/Drains/Airways    Name:   Placement date:   Placement time:   Site:   Days:   Peripheral IV 04/05/18 Right Hand   04/05/18    2350    Hand   1   Peripheral IV 04/06/18 Left Antecubital   04/06/18    0010    Antecubital   less than 1   NG/OG Tube Orogastric 16 Fr. Left mouth Xray   04/06/18    0039    Left mouth   less than 1   Airway 7.5 mm   04/06/18    0006     less than 1   Airway 7.5 mm   04/06/18    0023     less than 1          Intake/Output Last 24 hours  Intake/Output Summary (Last 24 hours) at  04/06/2018 0117 Last data filed at 04/06/2018 0108 Gross per 24 hour  Intake 200 ml  Output -  Net 200 ml    Labs/Imaging Results for orders placed or performed during the hospital encounter of 04/05/18 (from the past 48 hour(s))  Basic metabolic panel     Status: Abnormal   Collection Time: 04/05/18 11:58 PM  Result Value Ref Range   Sodium 138 135 - 145 mmol/L   Potassium 4.2 3.5 - 5.1 mmol/L   Chloride 107 98 - 111 mmol/L   CO2 24 22 - 32 mmol/L   Glucose, Bld 103 (H) 70 - 99 mg/dL   BUN 28 (H) 6 - 20 mg/dL   Creatinine, Ser 7.61 (H) 0.44 - 1.00 mg/dL   Calcium 9.4 8.9 - 47.0 mg/dL   GFR calc non Af Amer >60 >60 mL/min   GFR calc Af Amer >60 >60 mL/min   Anion gap 7 5 - 15    Comment: Performed at [facility], [address] [place], [place] [address]  CBC with Differential     Status: Abnormal   Collection Time: 04/05/18 11:58 PM  Result Value Ref Range   WBC 10.6 (H) 4.0 - 10.5 K/uL   RBC 4.20  3.87 - 5.11 MIL/uL   Hemoglobin 12.7 12.0 - 15.0 g/dL   HCT 16.1 09.6 - 04.5 %   MCV 92.9 80.0 - 100.0 fL   MCH 30.2 26.0 - 34.0 pg   MCHC 32.6 30.0 - 36.0 g/dL   RDW 40.9 81.1 - 91.4 %   Platelets 287 150 - 400 K/uL   nRBC 0.0 0.0 - 0.2 %   Neutrophils Relative % 41 %   Neutro Abs 4.4 1.7 - 7.7 K/uL   Lymphocytes Relative 44 %   Lymphs Abs 4.6 (H) 0.7 - 4.0 K/uL   Monocytes Relative 13 %   Monocytes Absolute 1.4 (H) 0.1 - 1.0 K/uL   Eosinophils Relative 2 %   Eosinophils Absolute 0.2 0.0 - 0.5 K/uL   Basophils Relative 0 %   Basophils Absolute 0.0 0.0 - 0.1 K/uL   WBC Morphology MORPHOLOGY UNREMARKABLE    Immature Granulocytes 0 %   Abs Immature Granulocytes 0.02 0.00 - 0.07 K/uL    Comment: Performed at [facility], [address], [place], [place] [address]   Ct Head Wo Contrast  Result Date: 04/06/2018 CLINICAL DATA:  Epistaxis, loss of consciousness and headache. EXAM: CT HEAD WITHOUT CONTRAST TECHNIQUE: Contiguous axial images  were obtained from the base of the skull through the vertex without intravenous contrast. COMPARISON:  Thin 15 18 FINDINGS: Brain: There is no mass, hemorrhage or extra-axial collection. The size and configuration of the ventricles and extra-axial CSF spaces are normal. The brain parenchyma is normal, without acute or chronic infarction. Vascular: No abnormal hyperdensity of the major intracranial arteries or dural venous sinuses. No intracranial atherosclerosis. Skull: The visualized skull base, calvarium and extracranial soft tissues are normal. Sinuses/Orbits: No fluid levels or advanced mucosal thickening of the visualized paranasal sinuses. No mastoid or middle ear effusion. The orbits are normal. IMPRESSION: Normal head CT. Electronically Signed   By: Deatra Robinson M.D.   On: 04/06/2018 00:58   Dg Chest Portable 1 View  Result Date: 04/06/2018 CLINICAL DATA:  Post intubation EXAM: PORTABLE CHEST 1 VIEW COMPARISON:  None. FINDINGS: Endotracheal tube tip is about 2.8 cm superior to the carina. Esophageal tube tip overlies the distal stomach. No acute airspace disease or effusion. Normal heart size. No pneumothorax. IMPRESSION: 1. Endotracheal tube tip about 2.8 cm superior to carina 2. Esophageal tube tip overlies the distal stomach Electronically Signed   By: Jasmine Pang M.D.   On: 04/06/2018 01:11    Pending Labs Unresulted Labs (From admission, onward)   None      Vitals/Pain Today's Vitals   04/06/18 0015 04/06/18 0039 04/06/18 0045 04/06/18 0100  BP: (!) 141/81  (!) 100/58 (!) 100/59  Pulse: (!) 115  78 73  Resp: Temp:      TempSrc:      SpO2: 99% 100% 100% 100%  Weight:      Height:      PainSc:        Isolation Precautions No active isolations  Medications Medications  acetaminophen (TYLENOL) tablet 650 mg (0 mg Oral Hold 04/06/18 0110)  propofol (DIPRIVAN) 1000 MG/100ML infusion (25 mcg/kg/min  66.7 kg Intravenous Rate/Dose Change 04/06/18 0037)  oxymetazoline  (AFRIN) 0.05 % nasal spray 1 spray (1 spray Each Nare Given 04/05/18 2238)  sodium chloride 0.9 % bolus 500 mL (500 mLs Intravenous New Bag/Given 04/06/18 0000)  LORazepam (ATIVAN) injection 1 mg (2 mg Intravenous Given 04/05/18 2354)  levETIRAcetam (KEPPRA) 1000 MG/100ML  IVPB (  Stopped 04/06/18 0108)  etomidate (AMIDATE) injection (20 mg Intravenous Given 04/06/18 0004)  succinylcholine (ANECTINE) injection (125 mg Intravenous Given 04/06/18 0004)  fentaNYL (SUBLIMAZE) injection 100 mcg (100 mcg Intravenous Given 04/06/18 0022)  LORazepam (ATIVAN) injection 2 mg (2 mg Intravenous Given 04/06/18 0017)  levETIRAcetam (KEPPRA) IVPB 500 mg/100 mL premix (0 mg Intravenous Stopped 04/06/18 0103)    Mobility non-ambulatory Low fall risk   Focused Assessments Neuro Assessment Handoff:  Swallow screen pass? No          Neuro Assessment:   Neuro Checks:      Last Documented NIHSS Modified Score:   Has TPA been given? No If patient is a Neuro Trauma and patient is going to OR before floor call report to 4N Charge nurse: [phone] or [phone]     R Recommendations: See Admitting Provider Note  Report given to:   Additional Notes: Intubated

## 2018-04-06 NOTE — Consult Note (Addendum)
Neurology Consultation Reason for Consult: Status epilepticus Referring Physician: Effie Shy, E  CC: Status epilepticus  History is obtained from: Patient's friend and boyfriend, chart  HPI: Kayla Buchanan is a 28 y.o. female with a history of seizures that started as a young child but eventually she began having them less often, though she did have her most recent seizure 3 years ago.  Given the duration of time since her previous seizure, she has been off of medication for quite some time.  Tonight, she sought care in the emergency department for a nosebleed and actually had been discharged and was leaving when she became unresponsive and had convulsive activity.  I asked her friend what she saw, and apparently one arm was slightly flexed and one arm was more straight, with low amplitude jerking activity.  She is not sure if there is head turn.  Due to continued activity despite 2 mg of IV Ativan  ROS: Unable to obtain due to altered mental status.   Past medical history: Childhood epilepsy.   Family history: Brother-seizures   Social History:  reports that she has never smoked. She has never used smokeless tobacco. She reports current alcohol use. She reports that she does not use drugs.   Exam: Current vital signs: BP (!) 98/53   Pulse 66   Temp 98.1 F (36.7 C) (Oral)   Resp 18   Ht 5\' 6"  (1.676 m)   Wt 66.7 kg   LMP  (LMP Unknown)   SpO2 100%   BMI 23.73 kg/m  Vital signs in last 24 hours: Temp:  [98.1 F (36.7 C)] 98.1 F (36.7 C) (03/03 2224) Pulse Rate:  [66-122] 66 (03/04 0145) Resp:  [12-41] 18 (03/04 0145) BP: (98-141)/(53-92) 98/53 (03/04 0145) SpO2:  [98 %-100 %] 100 % (03/04 0145) FiO2 (%):  [100 %] 100 % (03/04 0039) Weight:  [66.7 kg] 66.7 kg (03/03 2224)   Physical Exam  Constitutional: Appears well-developed and well-nourished.  Psych: Affect appropriate to situation Eyes: No scleral injection HENT: Intubated Head: Normocephalic.  Cardiovascular:  Normal rate and regular rhythm.  Respiratory: Ventilated GI: Soft.  No distension. There is no tenderness.  Skin: WDI  Neuro: Limited initially by sedation, but this was paused for the exam. Mental Status: Once the patient is stimulated enough to achieve response, the patient becomes intensely agitated with bilateral purposeful movements, but does not clearly follow commands. Cranial Nerves: II: She blinks to threat bilaterally pupils are equal, round, and reactive to light.   III,IV, VI: Eyes are midline, but she does cross midline in both directions. V:VII: blinks to eyelid stimulation bilaterally.  X: cough present Motor: She has strong purposeful movements bilaterally.  She moves all extremities well.  She occasionally has a low amplitude shaking of her bilateral legs which is rapidly aborted with nailbed pressure. Sensory: She response to noxious stimulation in all 4 extremities Deep Tendon Reflexes: 2+ and symmetric in the biceps and patellae.  Cerebellar: Does not perform      I have reviewed labs in epic and the results pertinent to this consultation are: BMP- borderline creatinine at 1.01  I have reviewed the images obtained: CT head-unremarkable  Impression: 28 year old female with a history of seizures who was intubated for what appeared to be a seizure.  Given her history of a seizure 3 years ago, and another now she likely needs antiepileptic therapy from this point forward if this was indeed a true epileptic seizure.  With brisk purposeful bilateral movements,  I feel that the risk of ongoing seizure is relatively low, and the fact that she is not following commands is due to her severe agitation on weaning sedation.  Recommendations: 1) Keppra 500 mg twice daily 2) EEG 3) neurology will follow   Ritta Slot, MD [facility] [phone]  If 7pm- 7am, please page neurology on call as listed in AMION.

## 2018-04-06 NOTE — ED Notes (Signed)
[facility] called for transport. 

## 2018-04-06 NOTE — Progress Notes (Signed)
I was called in by boyfriend to patient's room.  Found patient having a jerking like movement.  Pt would not respond to her name or stimulation.  CCM Md called in to room.  Order received to give 2mg  Ativan once.  MD assessed patient, but she continued to be unresponsive.  Vital signs were stable and O2 was 100% on 2L [place].  Jerking movements stopped 1 minutes after ativan was given.  CCM Md notified Neurology.  Neurologist arrived in patient room but jerking like movements had already stopped.  Orders received to notify him if it occurs again and to not give ativan.  Will continue to monitor patient closely.

## 2018-04-06 NOTE — Progress Notes (Addendum)
I was asked to step in to patients room.  She was hyperventilating with RR in the 60s.  She was restless in the bed.  Told her that she needed to calm down and take slow deep breaths.  Patient calmed down and within a few minutes she started to have jerking movement all over her body, except head.  Paged MD.  No orders received at this time.  Only to continue to monitor patient.  Patient eventually calmed her self down and is now resting.  O2 never decreased from 98% on 2L [place] and vital signs were stable.  Will continue to monitor patient.

## 2018-04-07 DIAGNOSIS — J9600 Acute respiratory failure, unspecified whether with hypoxia or hypercapnia: Secondary | ICD-10-CM

## 2018-04-07 LAB — CBC WITH DIFFERENTIAL/PLATELET
Abs Immature Granulocytes: 0.01 10*3/uL (ref 0.00–0.07)
Basophils Absolute: 0 10*3/uL (ref 0.0–0.1)
Basophils Relative: 1 %
EOS ABS: 0.1 10*3/uL (ref 0.0–0.5)
Eosinophils Relative: 2 %
HCT: 33.5 % — ABNORMAL LOW (ref 36.0–46.0)
Hemoglobin: 10.6 g/dL — ABNORMAL LOW (ref 12.0–15.0)
Immature Granulocytes: 0 %
Lymphocytes Relative: 33 %
Lymphs Abs: 2.1 10*3/uL (ref 0.7–4.0)
MCH: 28.6 pg (ref 26.0–34.0)
MCHC: 31.6 g/dL (ref 30.0–36.0)
MCV: 90.5 fL (ref 80.0–100.0)
Monocytes Absolute: 0.7 10*3/uL (ref 0.1–1.0)
Monocytes Relative: 12 %
Neutro Abs: 3.3 10*3/uL (ref 1.7–7.7)
Neutrophils Relative %: 52 %
Platelets: 222 10*3/uL (ref 150–400)
RBC: 3.7 MIL/uL — ABNORMAL LOW (ref 3.87–5.11)
RDW: 13.3 % (ref 11.5–15.5)
WBC: 6.3 10*3/uL (ref 4.0–10.5)
nRBC: 0 % (ref 0.0–0.2)

## 2018-04-07 LAB — PHOSPHORUS: Phosphorus: 2.6 mg/dL (ref 2.5–4.6)

## 2018-04-07 LAB — MAGNESIUM: MAGNESIUM: 2 mg/dL (ref 1.7–2.4)

## 2018-04-07 MED ORDER — LEVETIRACETAM 500 MG PO TABS: 500.0000 mg | ORAL_TABLET | Freq: Two times a day (BID) | ORAL | Status: DC

## 2018-04-07 MED ORDER — LEVETIRACETAM 500 MG PO TABS: 500.0000 mg | ORAL_TABLET | Freq: Two times a day (BID) | ORAL | 0 refills | Status: AC

## 2018-04-07 NOTE — Progress Notes (Signed)
NAME:  Kayla Buchanan, MRN:  [id number], DOB:  05/03/1990, LOS: 1 ADMISSION DATE:  04/05/2018, CONSULTATION DATE:  04/06/18 REFERRING MD:  Effie Shy  CHIEF COMPLAINT:  Seizures   Brief History   Kayla Buchanan is a 28 y.o. female who was admitted 3/4 with status epilepticus.  History of present illness   Pt is encephelopathic; therefore, this HPI is obtained from chart review. Kayla Buchanan is a 28 y.o. female who has a PMH as outlined below (see "past medical history").  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  Afrin was administered and the nares were packed with combat gauze.  Bleeding controlled with these measures.  Around 11:45 PM, pt collapsed and became unresponsive.  She had tonic clonic activity of both arms and legs.  She did not respond to ativan or keppra; therefore was intubated.  She was then loaded with 1500mg  of Keppra after discussions with neurology.  Per mother, pt has hx of seizures as a child and was previously on Depakote.  She last had a seizure roughly 3 years prior which resolved spontaneously.  She was transferred to [facility] for further evaluation and monitoring.  Past Medical History  Seizures.  Significant Hospital Events   3/4 > admit.  Consults:  Neuro.  Procedures:  ETT 3/4 > 04/06/2018  Significant Diagnostic Tests:  CT head 3/4 > negative. 04/06/2018 EEG>> demonstrates 1 pseudoseizure  Micro Data:  None.  Antimicrobials:  None.   Interim history/subjective:  Noted to have 1 pseudoseizure on EEG during the night Okay to transfer out of the unit and to [facility].  Objective:  Blood pressure 117/62, pulse 96, temperature 98.2 F (36.8 C), temperature source Axillary, resp. rate 19, height 5\' 6"  (1.676 m), weight 64.8 kg, SpO2 97 %.        Intake/Output Summary (Last 24 hours) at 04/07/2018 0848 Last data filed at 04/07/2018 0600 Gross per 24 hour  Intake 1663.07 ml  Output -    Net 1663.07 ml   Filed Weights   04/05/18 2224 04/06/18 0500 04/07/18 0500  Weight: 66.7 kg 64.8 kg 64.8 kg    Examination: General: Well-nourished well-developed female no acute distress HEENT: Nipples equal reactive light.  JVD lymphadenopathy is appreciated Neuro: No overt neurological deficits appreciated CV: s1s2 rrr, no m/r/g PULM: even/non-labored, lungs bilaterally clear FA:OZHY, non-tender, bsx4 active  Extremities: warm/dry, negative edema  Skin: no rashes or lesions    Assessment & Plan:   Status epilepticus.  Pseudoseizures -Per neurology -Urine drug screen was not done -Okay to transfer out of the intensive care unit to [facility]  Respiratory insufficiency - due to inability to protect the airway in the setting of above. -She has been extubated 48 hours 48 hours no respiratory issues noted   AKI. Lab Results  Component Value Date   CREATININE 0.88 04/06/2018   CREATININE 1.01 (H) 04/05/2018   CREATININE 0.91 11/16/2016   Recent Labs  Lab 04/05/18 2358 04/06/18 0342 04/06/18 0405  K 4.2 3.6 3.5     -Saline lock -Trend chemistries  Epistaxis - resolved. -Monitor for further bleeding none noted on 04/07/2018   Best Practice:  Diet: Tolerating regular diet Pain/Anxiety/Delirium protocol (if indicated): None VAP protocol (if indicated): Needed DVT prophylaxis: SCD's / Heparin. GI prophylaxis: PPI. Glucose control: N/A. Mobility: Bedrest. Code Status: Full. Family Communication: Communicated with patient.  Noted to have a pseudoseizure during the night.  Was confirmed via EEG. Disposition: Okay to transfer out of the intensive care and to [facility] service.  Labs   CBC: Recent Labs  Lab 04/05/18 2358 04/06/18 0342 04/06/18 0405 04/07/18 0401  WBC 10.6* 6.9  --  6.3  NEUTROABS 4.4  --   --  3.3  HGB 12.7 10.7* 9.9* 10.6*  HCT 39.0 33.0* 29.0* 33.5*  MCV 92.9 89.2  --  90.5  PLT 287 245  --  222   Basic Metabolic Panel: Recent Labs   Lab 04/05/18 2358 04/06/18 0342 04/06/18 0405 04/07/18 0401  NA 138 139 139  --   K 4.2 3.6 3.5  --   CL 107 111  --   --   CO2 24 23  --   --   GLUCOSE 103* 104*  --   --   BUN 28* 22*  --   --   CREATININE 1.01* 0.88  --   --   CALCIUM 9.4 8.6*  --   --   MG  --  2.0  --  2.0  PHOS  --  3.4  --  2.6   GFR: Estimated Creatinine Clearance: 90.7 mL/min (by C-G formula based on SCr of 0.88 mg/dL). Recent Labs  Lab 04/05/18 2358 04/06/18 0342 04/07/18 0401  WBC 10.6* 6.9 6.3   Liver Function Tests: No results for input(s): AST, ALT, ALKPHOS, BILITOT, PROT, ALBUMIN in the last 168 hours. No results for input(s): LIPASE, AMYLASE in the last 168 hours. No results for input(s): AMMONIA in the last 168 hours. ABG    Component Value Date/Time   PHART 7.462 (H) 04/06/2018 0405   PCO2ART 31.3 (L) 04/06/2018 0405   PO2ART 178.0 (H) 04/06/2018 0405   HCO3 22.3 04/06/2018 0405   TCO2 23 04/06/2018 0405   ACIDBASEDEF 1.0 04/06/2018 0405   O2SAT 100.0 04/06/2018 0405    Coagulation Profile: No results for input(s): INR, PROTIME in the last 168 hours. Cardiac Enzymes: No results for input(s): CKTOTAL, CKMB, CKMBINDEX, TROPONINI in the last 168 hours. HbA1C: No results found for: HGBA1C CBG: No results for input(s): GLUCAP in the last 168 hours.    Critical care time: 30 min.    Brett Canales Luke Falero ACNP [facility] PCCM Pager [phone] till 1 pm If no answer page [phone] 04/07/2018, 8:48 AM\

## 2018-04-07 NOTE — Procedures (Signed)
LTM-EEG Report  HISTORY: Continuous video-EEG monitoring performed for 28 year old with shaking spells. ACQUISITION: International 10-20 system for electrode placement; 18 channels with additional eyes linked to ipsilateral ears and EKG. Additional T1-T2 electrodes were used. Continuous video recording obtained.   EEG NUMBER:  MEDICATIONS:  Day 1: see EMR  DAY #1: from 1716 04/06/18 to 0730 04/07/18  BACKGROUND: An overall medium voltage continuous recording with good spontaneous variability and reactivity. Waking background consisted of a medium voltage 10-11 Hz posterior dominant rhythm bilaterally with low voltage beta activity in the bilateral frontocentral regions and some medium voltage theta activity diffusely. Sleep was captured with normal stage II sleep architecture.   EPILEPTIFORM/PERIODIC ACTIVITY: none SEIZURES: none EVENTS: There were 2 typical clinical events of unresponsiveness, eye closure, bilateral leg shaking, and rocking movements of the body. These had no EEG correlate and appeared consistent with psychogenic non-epileptic spells.   EKG: no significant arrhythmia  SUMMARY: This was a normal continuous video EEG with no epileptiform discharges. There were 2 typical pseudoseizures captured.

## 2018-04-07 NOTE — Discharge Summary (Signed)
Physician Discharge Summary  Patient ID: Kayla Buchanan MRN: [id number] DOB/AGE: 27-18-1992 26 y.o.  Admit date: 04/05/2018 Discharge date: 04/07/2018  Problem List Active Problems:   Seizure [facility])   Epistaxis   Endotracheally intubated  HPI: Pt is encephelopathic; therefore, this HPI is obtained from chart review. Kayla Buchanan is a 28 y.o. female who has a PMH as outlined below (see "past medical history").  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  Afrin was administered and the nares were packed with combat gauze.  Bleeding controlled with these measures.  Around 11:45 PM, pt collapsed and became unresponsive.  She had tonic clonic activity of both arms and legs.  She did not respond to ativan or keppra; therefore was intubated.  She was then loaded with 1500mg  of Keppra after discussions with neurology.  Per mother, pt has hx of seizures as a child and was previously on Depakote.  She last had a seizure roughly 3 years prior which resolved spontaneously.  She was transferred to [facility] for further evaluation and monitoring. Hospital Course:  Status epilepticus.  Pseudoseizures -Per neurology.  She has a follow-up with neurology scheduled as an ambulatory follow-up.  Should be placed on Keppra 500 mg twice daily she was given a prescription for 30 days.  Is also been suggested that she have a primary care physician consult in the near future.  She is being discharged home on 04/07/2018 in improved condition.  She has been cleared by neurology. Discharged home in improved condition in no acute distress.  Respiratory insufficiency - due to inability to protect the airway in the setting of above. -She has been extubated 48 hours 48 hours no respiratory issues noted.    Labs at discharge Lab Results  Component Value Date   CREATININE 0.88 04/06/2018   BUN 22 (H) 04/06/2018   NA 139 04/06/2018   K 3.5 04/06/2018   CL 111 04/06/2018   CO2 23 04/06/2018   Lab Results  Component Value Date   WBC 6.3 04/07/2018   HGB 10.6 (L) 04/07/2018   HCT 33.5 (L) 04/07/2018   MCV 90.5 04/07/2018   PLT 222 04/07/2018   Lab Results  Component Value Date   ALT 15 11/16/2016   AST 20 11/16/2016   ALKPHOS 58 11/16/2016   BILITOT 0.5 11/16/2016   No results found for: INR, PROTIME  Current radiology studies Ct Head Wo Contrast  Result Date: 04/06/2018 CLINICAL DATA:  Epistaxis, loss of consciousness and headache. EXAM: CT HEAD WITHOUT CONTRAST TECHNIQUE: Contiguous axial images were obtained from the base of the skull through the vertex without intravenous contrast. COMPARISON:  Thin 15 18 FINDINGS: Brain: There is no mass, hemorrhage or extra-axial collection. The size and configuration of the ventricles and extra-axial CSF spaces are normal. The brain parenchyma is normal, without acute or chronic infarction. Vascular: No abnormal hyperdensity of the major intracranial arteries or dural venous sinuses. No intracranial atherosclerosis. Skull: The visualized skull base, calvarium and extracranial soft tissues are normal. Sinuses/Orbits: No fluid levels or advanced mucosal thickening of the visualized paranasal sinuses. No mastoid or middle ear effusion. The orbits are normal. IMPRESSION: Normal head CT. Electronically Signed   By: Deatra Robinson M.D.   On: 04/06/2018 00:58   Dg Chest Portable 1 View  Result Date: 04/06/2018 CLINICAL DATA:  Post intubation EXAM: PORTABLE CHEST 1 VIEW COMPARISON:  None. FINDINGS: Endotracheal tube tip is about  2.8 cm superior to the carina. Esophageal tube tip overlies the distal stomach. No acute airspace disease or effusion. Normal heart size. No pneumothorax. IMPRESSION: 1. Endotracheal tube tip about 2.8 cm superior to carina 2. Esophageal tube tip overlies the distal stomach Electronically Signed   By: Jasmine Pang M.D.   On: 04/06/2018 01:11     EEG did not demonstrate any status  epilepticus.  There is concern of pseudoseizures. Disposition:  Discharge disposition: 01-Home or Self Care       Discharge Instructions    Ambulatory referral to Neurology   Complete by:  As directed    Patient with diagnosed seizures at 17 now had LTM with 2 episodes of non-epileptic events. Placed on Keppra 500 mg BID and would like you to see out patient. If can sooner than later but know your busy     Allergies as of 04/07/2018      Reactions   Morphine Anaphylaxis   Pertussis Vaccines Anaphylaxis      Medication List    STOP taking these medications   ibuprofen 200 MG tablet Commonly known as:  ADVIL,MOTRIN     TAKE these medications   levETIRAcetam 500 MG tablet Commonly known as:  KEPPRA Take 1 tablet (500 mg total) by mouth 2 (two) times daily.         Discharged Condition: good  Time spent on discharge 40 minutes.  Vital signs at Discharge. Temp:  [97.9 F (36.6 C)-98.7 F (37.1 C)] 98.2 F (36.8 C) (03/05 0400) Pulse Rate:  [62-111] 96 (03/05 0800) Resp:  [11-30] 19 (03/05 0800) BP: (91-117)/(44-94) 117/62 (03/05 0755) SpO2:  [97 %-100 %] 97 % (03/05 0800) Weight:  [64.8 kg] 64.8 kg (03/05 0500) Office follow up Special Information or instructions. She has an ambulatory follow-up referral to neurology.  He is to follow-up with Dr. Patrcia Dolly at [address] [place], [place] [address].  Her contact number is [phone] Signed: Brett Canales  ACNP [facility] PCCM Pager [phone] till 1 pm If no answer page [phone] 04/07/2018, 11:44 AM

## 2018-04-07 NOTE — Progress Notes (Addendum)
NEUROLOGY PROGRESS NOTE  Subjective: Patient states that she did have 2 episodes overnight.  These were captured by LTM.  At this point time she is only complaining of a headache.  Exam: Vitals:   04/07/18 0755 04/07/18 0800  BP: 117/62   Pulse: 86 96  Resp: 17 19  Temp:    SpO2: 99% 97%    Physical Exam   HEENT-  Normocephalic, no lesions, without obvious abnormality.  Normal external eye and conjunctiva.  Extremities- Warm, dry and intact Musculoskeletal-no joint tenderness, deformity or swelling Skin-warm and dry, no hyperpigmentation, vitiligo, or suspicious lesions    Neuro:  Mental Status: Alert, oriented, thought content appropriate.  Speech fluent without evidence of aphasia.  Able to follow 3 step commands without difficulty. Cranial Nerves: II:  Visual fields grossly normal,  III,IV, VI: ptosis not present, extra-ocular motions intact bilaterally pupils equal, round, reactive to light and accommodation V,VII: smile symmetric, facial light touch sensation normal bilaterally VIII: hearing normal bilaterally IX,X: uvula rises midline XI: bilateral shoulder shrug XII: midline tongue extension Motor: Moving extremities antigravity Sensory: Pinprick and light touch intact throughout, bilaterally Deep Tendon Reflexes: 2+ and symmetric throughout Plantars: Right: downgoing   Left: downgoing Cerebellar: normal finger-to-nose,      Medications:  Scheduled: . heparin  5,000 Units Subcutaneous Q8H    Pertinent Labs/Diagnostics: EEG LTM: DAY #1: from 1716 04/06/18 to 0730 04/07/18  BACKGROUND: An overall medium voltage continuous recording with good spontaneous variability and reactivity. Waking background consisted of a medium voltage 10-11 Hz posterior dominant rhythm bilaterally with low voltage beta activity in the bilateral frontocentral regions and some medium voltage theta activity diffusely. Sleep was captured with normal stage II sleep architecture.    EPILEPTIFORM/PERIODIC ACTIVITY: none SEIZURES: none EVENTS: There were 2 typical clinical events of unresponsiveness, eye closure, bilateral leg shaking, and rocking movements of the body. These had no EEG correlate and appeared consistent with psychogenic non-epileptic spells.   EKG: no significant arrhythmia  SUMMARY: This was a normal continuous video EEG with no epileptiform discharges. There were 2 typical pseudoseizures captured.   Ct Head Wo Contrast Result Date: 04/06/2018 . IMPRESSION: Normal head CT. Electronically Signed   By: Deatra Robinson M.D.   On: 04/06/2018 00:58      Felicie Morn PA-C [facility] [phone]   04/07/2018, 9:14 AM   Assessment: 28 year old female with history of seizures.  Patient has been placed on LTM with 2 episodes captured which were non-epileptiform.  Currently still on Keppra 500 mg twice daily.   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 Use caution when using heavy equipment or power tools. Avoid working on ladders or at heights. Take showers instead of baths. Ensure the water temperature is not too high on the home water heater. Do not go swimming alone. When caring for infants or small children, sit down when holding, feeding, or changing them to minimize risk of injury to the child in the event you have a seizure.   Also, Maintain good sleep hygiene. Avoid alcohol.    NEUROHOSPITALIST ADDENDUM Performed a face to face diagnostic evaluation.   I have reviewed the contents of history and physical  exam as documented by PA/ARNP/Resident and agree with above documentation.  I have discussed and formulated the above  plan as documented. Edits to the note have been made as needed.  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  Last seizure was 2 years ago.  She presented this time with multiple spells of generalized shaking-initially intubated for airway protection due to prolonged shaking episode.  She was extubated and had 2 spells of shaking.  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  I discussed with the patient regarding her diagnosis and that these were not electrographic seizures and there is no need to increase her seizure medication.  I counseled that these are likely psychological from underlying stress and she would benefit from psychotherapy.  Since she does have a history of seizures in childhood I recommend we keep her on antiepileptic, currently taking Keppra 500 mg twice daily.  Given her complexity of case-of seizure history as well as nonepileptic events I do think she should see a neurologist as an outpatient.  It may be beneficial for [place] to switch from Keppra to lamotrigine as this could help prevent both seizures and help her mood.  However, as this medication needs to be titrated I would not be making this change as an inpatient.  I have clearly instructed her not to drive for 6 months as well as general seizure precautions and the patient has voiced understanding.  Her mother is on her way, and I have expressed that when she arrives to inform the nurse to call me so I can relay information to her as well.  Thanks for the consult.  We will discontinue EEG.  Please call back with any questions.  Outpatient referral order has been placed.     Georgiana Spinner Kamarion Zagami MD [facility] [phone]   If 7pm to 7am, please  call on call as listed on AMION.

## 2018-04-07 NOTE — Progress Notes (Signed)
LTM EEG discontinued. No skin breakdown noted. 

## 2018-04-07 NOTE — Progress Notes (Signed)
Pt had an episode while sitting up in the bed, rocking back and forth. Pt woke up after trapezius pinch. During episode, RN gave NS flush through IV. Pt then woke up and asked for crackers. Pt now playing on phone. Will continue to monitor

## 2019-05-14 ENCOUNTER — Emergency Department (HOSPITAL_COMMUNITY): Payer: Self-pay

## 2019-05-14 ENCOUNTER — Encounter (HOSPITAL_COMMUNITY): Payer: Self-pay | Admitting: Emergency Medicine

## 2019-05-14 ENCOUNTER — Other Ambulatory Visit: Payer: Self-pay

## 2019-05-14 ENCOUNTER — Emergency Department (HOSPITAL_COMMUNITY)
Admission: EM | Admit: 2019-05-14 | Discharge: 2019-05-14 | Disposition: A | Discharge status: Home / Self Care | Payer: Self-pay | Attending: Emergency Medicine | Admitting: Emergency Medicine

## 2019-05-14 DIAGNOSIS — T148XXA Other injury of unspecified body region, initial encounter: Secondary | ICD-10-CM

## 2019-05-14 DIAGNOSIS — Y998 Other external cause status: Secondary | ICD-10-CM | POA: Insufficient documentation

## 2019-05-14 DIAGNOSIS — S51831A Puncture wound without foreign body of right forearm, initial encounter: Secondary | ICD-10-CM | POA: Insufficient documentation

## 2019-05-14 DIAGNOSIS — L03113 Cellulitis of right upper limb: Secondary | ICD-10-CM | POA: Insufficient documentation

## 2019-05-14 DIAGNOSIS — Y92828 Other wilderness area as the place of occurrence of the external cause: Secondary | ICD-10-CM | POA: Insufficient documentation

## 2019-05-14 DIAGNOSIS — W450XXA Nail entering through skin, initial encounter: Secondary | ICD-10-CM | POA: Insufficient documentation

## 2019-05-14 DIAGNOSIS — R21 Rash and other nonspecific skin eruption: Secondary | ICD-10-CM | POA: Insufficient documentation

## 2019-05-14 DIAGNOSIS — Z23 Encounter for immunization: Secondary | ICD-10-CM | POA: Insufficient documentation

## 2019-05-14 DIAGNOSIS — Y9389 Activity, other specified: Secondary | ICD-10-CM | POA: Insufficient documentation

## 2019-05-14 LAB — CBC WITH DIFFERENTIAL/PLATELET
Abs Immature Granulocytes: 0.04 10*3/uL (ref 0.00–0.07)
Basophils Absolute: 0 10*3/uL (ref 0.0–0.1)
Basophils Relative: 0 %
Eosinophils Absolute: 0.1 10*3/uL (ref 0.0–0.5)
Eosinophils Relative: 0 %
HCT: 41.1 % (ref 36.0–46.0)
Hemoglobin: 13.5 g/dL (ref 12.0–15.0)
Immature Granulocytes: 0 %
Lymphocytes Relative: 21 %
Lymphs Abs: 2.4 10*3/uL (ref 0.7–4.0)
MCH: 30.6 pg (ref 26.0–34.0)
MCHC: 32.8 g/dL (ref 30.0–36.0)
MCV: 93.2 fL (ref 80.0–100.0)
Monocytes Absolute: 0.9 10*3/uL (ref 0.1–1.0)
Monocytes Relative: 8 %
Neutro Abs: 8.3 10*3/uL — ABNORMAL HIGH (ref 1.7–7.7)
Neutrophils Relative %: 71 %
Platelets: 309 10*3/uL (ref 150–400)
RBC: 4.41 MIL/uL (ref 3.87–5.11)
RDW: 13.2 % (ref 11.5–15.5)
WBC: 11.7 10*3/uL — ABNORMAL HIGH (ref 4.0–10.5)
nRBC: 0 % (ref 0.0–0.2)

## 2019-05-14 LAB — COMPREHENSIVE METABOLIC PANEL
ALT: 16 U/L (ref 0–44)
AST: 21 U/L (ref 15–41)
Albumin: 4.6 g/dL (ref 3.5–5.0)
Alkaline Phosphatase: 49 U/L (ref 38–126)
Anion gap: 11 (ref 5–15)
BUN: 12 mg/dL (ref 6–20)
CO2: 21 mmol/L — ABNORMAL LOW (ref 22–32)
Calcium: 8.9 mg/dL (ref 8.9–10.3)
Chloride: 112 mmol/L — ABNORMAL HIGH (ref 98–111)
Creatinine, Ser: 0.75 mg/dL (ref 0.44–1.00)
GFR calc Af Amer: >60 mL/min (ref >60)
GFR calc non Af Amer: >60 mL/min (ref >60)
Glucose, Bld: 87 mg/dL (ref 70–99)
Potassium: 4 mmol/L (ref 3.5–5.1)
Sodium: 144 mmol/L (ref 135–145)
Total Bilirubin: 0.9 mg/dL (ref 0.3–1.2)
Total Protein: 7.2 g/dL (ref 6.5–8.1)

## 2019-05-14 LAB — LACTIC ACID, PLASMA: Lactic Acid, Venous: 1.5 mmol/L (ref 0.5–1.9)

## 2019-05-14 LAB — CK: Total CK: 92 U/L (ref 38–234)

## 2019-05-14 MED ORDER — FENTANYL CITRATE (PF) 100 MCG/2ML IJ SOLN
50.0000 ug | Freq: Once | INTRAMUSCULAR | Status: AC
  Administered 2019-05-14: 50 ug via INTRAVENOUS
  Filled 2019-05-14: qty 2

## 2019-05-14 MED ORDER — TETANUS-DIPHTH-ACELL PERTUSSIS 5-2.5-18.5 LF-MCG/0.5 IM SUSP
0.5000 mL | Freq: Once | INTRAMUSCULAR | Status: AC
  Administered 2019-05-14: 0.5 mL via INTRAMUSCULAR
  Filled 2019-05-14: qty 0.5

## 2019-05-14 MED ORDER — DOXYCYCLINE HYCLATE 100 MG PO CAPS: 100.0000 mg | ORAL_CAPSULE | Freq: Two times a day (BID) | ORAL | 0 refills | Status: AC

## 2019-05-14 MED ORDER — SODIUM CHLORIDE 0.9 % IV BOLUS
1000.0000 mL | Freq: Once | INTRAVENOUS | Status: AC
  Administered 2019-05-14: 20:00:00 1000 mL via INTRAVENOUS

## 2019-05-14 MED ORDER — CIPROFLOXACIN HCL 500 MG PO TABS: 500.0000 mg | ORAL_TABLET | Freq: Two times a day (BID) | ORAL | 0 refills | Status: AC

## 2019-05-14 MED ORDER — HYDROCODONE-ACETAMINOPHEN 5-325 MG PO TABS
2.0000 | ORAL_TABLET | Freq: Once | ORAL | Status: AC
  Administered 2019-05-14: 22:00:00 2 via ORAL
  Filled 2019-05-14: qty 2

## 2019-05-14 MED ORDER — VANCOMYCIN HCL IN DEXTROSE 1-5 GM/200ML-% IV SOLN
1000.0000 mg | Freq: Once | INTRAVENOUS | Status: AC
  Administered 2019-05-14: 21:00:00 1000 mg via INTRAVENOUS
  Filled 2019-05-14: qty 200

## 2019-05-14 MED ORDER — METRONIDAZOLE IN NACL 5-0.79 MG/ML-% IV SOLN
500.0000 mg | Freq: Once | INTRAVENOUS | Status: AC
  Administered 2019-05-14: 20:00:00 500 mg via INTRAVENOUS
  Filled 2019-05-14: qty 100

## 2019-05-14 MED ORDER — SODIUM CHLORIDE 0.9 % IV SOLN
2.0000 g | Freq: Once | INTRAVENOUS | Status: AC
  Administered 2019-05-14: 2 g via INTRAVENOUS
  Filled 2019-05-14: qty 2

## 2019-05-14 NOTE — Progress Notes (Signed)
A consult was received from an ED physician for Vancomycin & Cefepime per pharmacy dosing.  The patient's profile has been reviewed for ht/wt/allergies/indication/available labs.   A one time order has been placed for Vancomycin 1gm & Cefepime 2gm IV.  Further antibiotics/pharmacy consults should be ordered by admitting physician if indicated.                       Thank you, Junita Push PharmD 05/14/2019  7:17 PM

## 2019-05-14 NOTE — ED Triage Notes (Signed)
Patient here from home with complaints of right arm pain after hitting it on [place]. Redness and swelling noted.

## 2019-05-14 NOTE — ED Notes (Addendum)
Pt A/O and ambulatory at discharge. Pt educated on antibiotic use. Pt verbalized understanding of discharge instructions.   Pt also stated she has taken Vicodin before without any reactions before given medication.

## 2019-05-14 NOTE — Discharge Instructions (Signed)
Take cipro and doxycycline as prescribed   Monitor for swelling   See your doctor   Return to ER if you have worse arm pain or swelling, fever, numbness in your arm

## 2019-05-14 NOTE — ED Provider Notes (Signed)
[facility]-EMERGENCY DEPT Provider Note   CSN: [id number] Arrival date & time: 05/14/19  1835     History Chief Complaint  Patient presents with  . Arm Swelling  . Arm Pain    Kayla Buchanan is a 29 y.o. female history of seizures but none not currently on any medicines here presenting with right arm swelling and pain.  Patient states that 4 hours prior to arrival, she jumped into the leg to save her 61-year-old child.  She states that in the process of doing so, she noticed that she got a puncture wound with a nail to the right arm.  She states that she noticed it right away and pulled it out.  States that over the last several hours, her right arm became acutely swollen and painful.  She states that she is able to still feel her right hand but started becoming slightly numb.  Denies any other injuries.  Of note, patient states that she had a possible allergic reaction to personal history vaccine when she was very young.  She states that she may have had seizures after her pertussis vaccine so she did not have Tdap.  The history is provided by the patient.       History reviewed. No pertinent past medical history.  Patient Active Problem List   Diagnosis Date Noted  . Seizure (HCC) 04/06/2018  . Epistaxis   . Endotracheally intubated     Past Surgical History:  Procedure Laterality Date  . SHOULDER SURGERY Left      OB History   No obstetric history on file.     No family history on file.  Social History   Tobacco Use  . Smoking status: Never Smoker  . Smokeless tobacco: Never Used  Substance Use Topics  . Alcohol use: Yes  . Drug use: No    Home Medications Prior to Admission medications   Medication Sig Start Date End Date Taking? Authorizing Provider  levETIRAcetam (KEPPRA) 500 MG tablet Take 1 tablet (500 mg total) by mouth 2 (two) times daily. Patient not taking: Reported on 05/14/2019 04/07/18   Minor, Vilinda Blanks, NP    Allergies      Morphine and Pertussis vaccines  Review of Systems   Review of Systems  Skin: Positive for rash and wound.  All other systems reviewed and are negative.   Physical Exam Updated Vital Signs BP 105/75   Pulse 81   Temp 98 F (36.7 C) (Oral)   Resp 18   LMP 04/26/2019 (Approximate)   SpO2 98%   Physical Exam Vitals and nursing note reviewed.  HENT:     Head: Normocephalic.     Nose: Nose normal.     Mouth/Throat:     Mouth: Mucous membranes are moist.  Eyes:     Extraocular Movements: Extraocular movements intact.     Pupils: Pupils are equal, round, and reactive to light.  Cardiovascular:     Rate and Rhythm: Normal rate and regular rhythm.     Pulses: Normal pulses.  Pulmonary:     Effort: Pulmonary effort is normal.     Breath sounds: Normal breath sounds.  Abdominal:     General: Abdomen is flat.     Palpations: Abdomen is soft.  Musculoskeletal:     Cervical back: Normal range of motion.     Comments: Puncture wound on the ulnar aspect of the right antecube.  There is no fluctuance but however there is extensive cellulitis covering  the forearm as well as upper arm.  No purulent discharge.  She does have good right radial artery pulse.  She is able to finger grasp and has normal capillary refill.  Skin:    Capillary Refill: Capillary refill takes less than 2 seconds.     Findings: Erythema present.  Neurological:     General: No focal deficit present.     Mental Status: She is alert.  Psychiatric:        Mood and Affect: Mood normal.        Behavior: Behavior normal.       ED Results / Procedures / Treatments   Labs (all labs ordered are listed, but only abnormal results are displayed) Labs Reviewed  CBC WITH DIFFERENTIAL/PLATELET - Abnormal; Notable for the following components:      Result Value   WBC 11.7 (*)    Neutro Abs 8.3 (*)    All other components within normal limits  COMPREHENSIVE METABOLIC PANEL - Abnormal; Notable for the following  components:   Chloride 112 (*)    CO2 21 (*)    All other components within normal limits  CULTURE, BLOOD (ROUTINE X 2)  CULTURE, BLOOD (ROUTINE X 2)  CK  LACTIC ACID, PLASMA    EKG None  Radiology DG Forearm Right  Result Date: 05/14/2019 CLINICAL DATA:  Redness and swelling EXAM: RIGHT FOREARM - 2 VIEW COMPARISON:  None. FINDINGS: No fracture or malalignment. No radiopaque foreign body. Edema within the soft tissues of the forearm. IMPRESSION: No acute osseous abnormality. Electronically Signed   By: Jasmine Pang M.D.   On: 05/14/2019 19:32   DG Humerus Right  Result Date: 05/14/2019 CLINICAL DATA:  Arm cellulitis EXAM: RIGHT HUMERUS - 2+ VIEW COMPARISON:  None. FINDINGS: No fracture or malalignment. No radiopaque foreign body or soft tissue emphysema. Soft tissue edema at the distal upper arm. IMPRESSION: No acute osseous abnormality. Electronically Signed   By: Jasmine Pang M.D.   On: 05/14/2019 19:31    Procedures Procedures (including critical care time)  Medications Ordered in ED Medications  metroNIDAZOLE (FLAGYL) IVPB 500 mg (500 mg Intravenous New Bag/Given 05/14/19 2029)  HYDROcodone-acetaminophen (NORCO/VICODIN) 5-325 MG per tablet 2 tablet (has no administration in time range)  Tdap (BOOSTRIX) injection 0.5 mL (0.5 mLs Intramuscular Given 05/14/19 1946)  fentaNYL (SUBLIMAZE) injection 50 mcg (50 mcg Intravenous Given 05/14/19 1943)  sodium chloride 0.9 % bolus 1,000 mL (0 mLs Intravenous Stopped 05/14/19 2051)  ceFEPIme (MAXIPIME) 2 g in sodium chloride 0.9 % 100 mL IVPB (0 g Intravenous Stopped 05/14/19 2050)  vancomycin (VANCOCIN) IVPB 1000 mg/200 mL premix (1,000 mg Intravenous New Bag/Given 05/14/19 2049)    ED Course  I have reviewed the triage vital signs and the nursing notes.  Pertinent labs & imaging results that were available during my care of the patient were reviewed by me and considered in my medical decision making (see chart for details).    MDM  Rules/Calculators/A&P                      Kayla Buchanan is a 29 y.o. female here presenting with right arm puncture wound and now has cellulitis of the right arm.  Consider necrotizing fasciitis versus cellulitis versus local reaction.  Will do sepsis work-up with labs and lactate and x-rays.  Will give broad-spectrum antibiotics.  10:00 PM WBC nl. Lactate nl. Xray showed no air.  Patient received broad-spectrum antibiotics.  Now the arm swelling  has gone down.  Will discharge patient on Cipro and doxycycline to cover both MRSA and Pseudomonas.  Final Clinical Impression(s) / ED Diagnoses Final diagnoses:  None    Rx / DC Orders ED Discharge Orders    None       Drenda Freeze, MD 05/14/19 2201

## 2019-05-20 LAB — CULTURE, BLOOD (ROUTINE X 2)
Culture: NO GROWTH
Culture: NO GROWTH

## 2021-06-01 IMAGING — CR DG FOREARM 2V*R*
2 series · 2 of 2 positions shown · non-contrast
Comparison: None.

CLINICAL DATA: Redness and swelling

EXAM:
RIGHT FOREARM - 2 VIEW

[x forearm ap right]
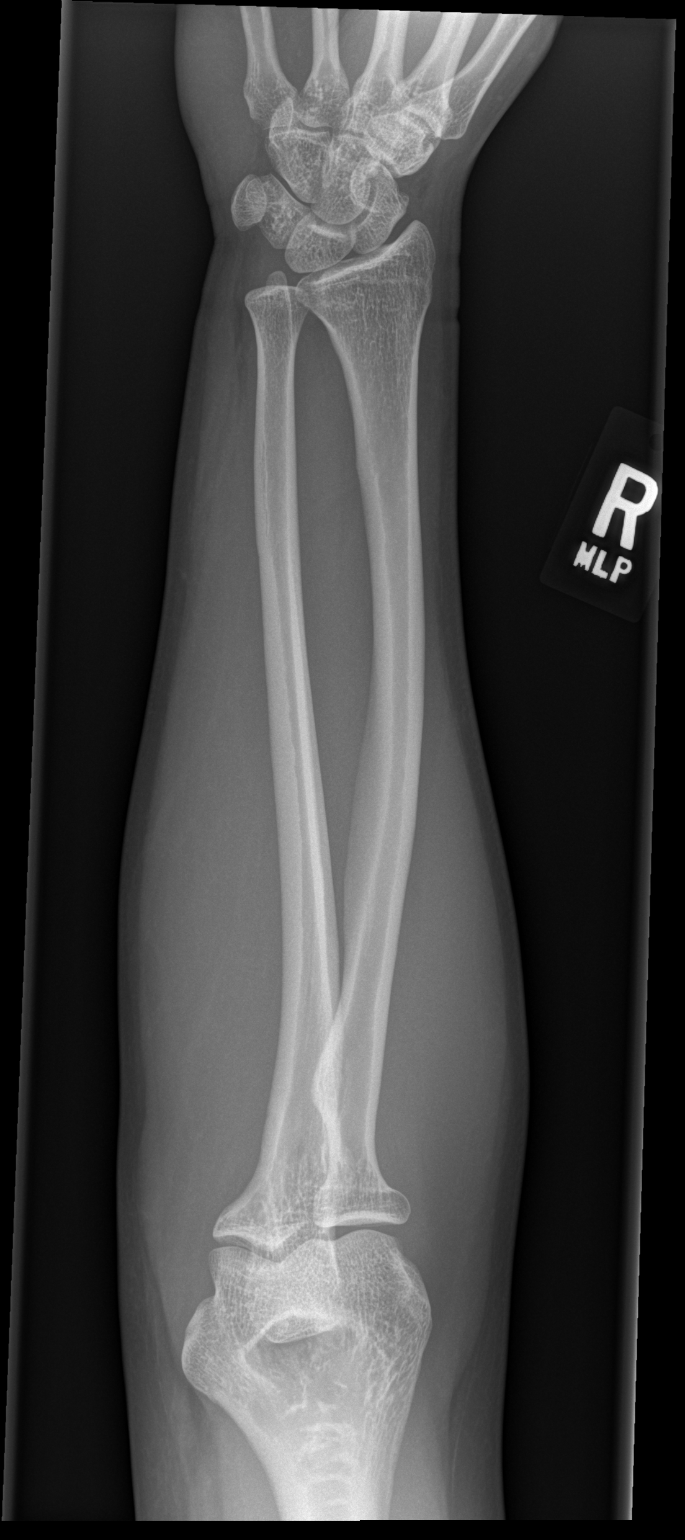

[x forearm lat right]
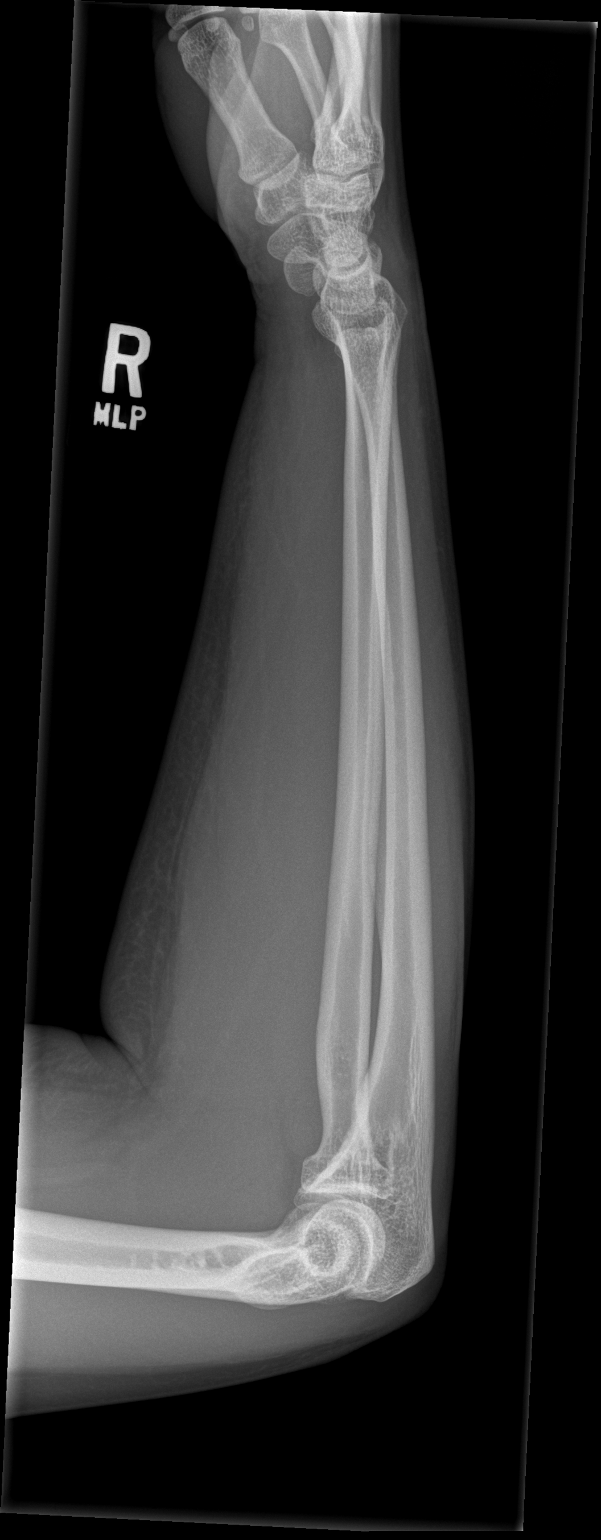

[2 of 2 positions shown; findings below may reference images not displayed]

FINDINGS: No fracture or malalignment. No radiopaque foreign body. Edema
within the soft tissues of the forearm.
IMPRESSION: No acute osseous abnormality.
